# Patient Record
Sex: Female | Born: 1995 | Race: Black or African American | Hispanic: No | Marital: Single | Smoking: Never smoker
Health system: Southern US, Community
[De-identification: ages and names within clinical notes are randomized; demographics above are authoritative.]

## PROBLEM LIST (undated history)

## (undated) HISTORY — PX: OTHER SURGICAL HISTORY: SHX169

## (undated) HISTORY — PX: DILATION AND CURETTAGE OF UTERUS: SHX78

---

## 2020-05-22 ENCOUNTER — Other Ambulatory Visit (HOSPITAL_COMMUNITY)
Admission: RE | Admit: 2020-05-22 | Discharge: 2020-05-22 | Disposition: A | Discharge status: Home / Self Care | Payer: Medicaid Other | Source: Ambulatory Visit | Attending: Obstetrics and Gynecology | Admitting: Obstetrics and Gynecology

## 2020-05-22 ENCOUNTER — Encounter: Payer: Self-pay | Admitting: Obstetrics and Gynecology

## 2020-05-22 ENCOUNTER — Other Ambulatory Visit: Payer: Self-pay

## 2020-05-22 ENCOUNTER — Ambulatory Visit (INDEPENDENT_AMBULATORY_CARE_PROVIDER_SITE_OTHER): Payer: Medicaid Other | Admitting: Obstetrics and Gynecology

## 2020-05-22 DIAGNOSIS — Z01419 Encounter for gynecological examination (general) (routine) without abnormal findings: Secondary | ICD-10-CM | POA: Insufficient documentation

## 2020-05-22 DIAGNOSIS — Z30019 Encounter for initial prescription of contraceptives, unspecified: Secondary | ICD-10-CM | POA: Diagnosis not present

## 2020-05-22 MED ORDER — ETONOGESTREL-ETHINYL ESTRADIOL 0.12-0.015 MG/24HR VA RING: VAGINAL_RING | VAGINAL | 12 refills | Status: DC

## 2020-05-22 NOTE — Patient Instructions (Signed)
Ethinyl Estradiol; Etonogestrel vaginal ring What is this medicine? ETHINYL ESTRADIOL; ETONOGESTREL (ETH in il es tra DYE ole; et oh noe JES trel) vaginal ring is a flexible, vaginal ring used as a contraceptive (birth control method). This medicine combines 2 types of female hormones, an estrogen and a progestin. This ring is used to prevent ovulation and pregnancy. Each ring is effective for 1 month. This medicine may be used for other purposes; ask your health care provider or pharmacist if you have questions. COMMON BRAND NAME(S): EluRyng, NuvaRing What should I tell my health care provider before I take this medicine? They need to know if you have any of these conditions:  abnormal vaginal bleeding  blood vessel disease or blood clots  breast, cervical, endometrial, ovarian, liver, or uterine cancer  diabetes  gallbladder disease  having surgery  heart disease or recent heart attack  high blood pressure  high cholesterol or triglycerides  history of irregular heartbeat or heart valve problems  kidney disease  liver disease  migraine headaches  protein C deficiency  protein S deficiency  recently had a baby, miscarriage, or abortion  stroke  systemic lupus erythematosus (SLE)  tobacco smoker  your age is more than 25 years old  an unusual or allergic reaction to estrogens, progestins, other medicines, foods, dyes, or preservatives  pregnant or trying to get pregnant  breast-feeding How should I use this medicine? Insert the ring into your vagina as directed. Follow the directions on the prescription label. The ring will remain place for 3 weeks and is then removed for a 1-week break. A new ring is inserted 1 week after the last ring was removed, on the same day of the week. Check often to make sure the ring is still in place. If the ring was out of the vagina for an unknown amount of time, you may not be protected from pregnancy. Perform a pregnancy test and  call your doctor. Do not use more often than directed. A patient package insert for the product will be given with each prescription and refill. Read this sheet carefully each time. The sheet may change frequently. Contact your pediatrician regarding the use of this medicine in children. Special care may be needed. Overdosage: If you think you have taken too much of this medicine contact a poison control center or emergency room at once. NOTE: This medicine is only for you. Do not share this medicine with others. What if I miss a dose? You will need to use the ring exactly as directed. It is very important to follow the schedule every cycle. If you do not use the ring as directed, you may not be protected from pregnancy. If the ring should slip out, is lost, or if you leave it in longer or shorter than you should, contact your health care professional for advice. What may interact with this medicine? Do not take this medicine with the following medications:  dasabuvir; ombitasvir; paritaprevir; ritonavir  ombitasvir; paritaprevir; ritonavir  vaginal lubricants or other vaginal products that are oil-based or silicone-based This medicine may also interact with the following medications:  acetaminophen  antibiotics or medicines for infections, especially rifampin, rifabutin, rifapentine, and griseofulvin, and possibly penicillins or tetracyclines  aprepitant or fosaprepitant  armodafinil  ascorbic acid (vitamin C)  barbiturate medicines, such as phenobarbital or primidone  bosentan  certain antiviral medicines for hepatitis, HIV or AIDS  certain medicines for cancer treatment  certain medicines for seizures like carbamazepine, clobazam, felbamate, lamotrigine, oxcarbazepine, phenytoin,  rufinamide, topiramate  certain medicines for treating high cholesterol  cyclosporine  dantrolene  elagolix  flibanserin  grapefruit juice  lesinurad  medicines for diabetes  medicines  to treat fungal infections, such as griseofulvin, miconazole, fluconazole, ketoconazole, itraconazole, posaconazole or voriconazole  mifepristone  mitotane  modafinil  morphine  mycophenolate  St. John's wort  tamoxifen  temazepam  theophylline or aminophylline  thyroid hormones  tizanidine  tranexamic acid  ulipristal  warfarin This list may not describe all possible interactions. Give your health care provider a list of all the medicines, herbs, non-prescription drugs, or dietary supplements you use. Also tell them if you smoke, drink alcohol, or use illegal drugs. Some items may interact with your medicine. What should I watch for while using this medicine? Visit your doctor or health care professional for regular checks on your progress. You will need a regular breast and pelvic exam and Pap smear while on this medicine. Check with your doctor or health care professional to see if you need an additional method of contraception during the first cycle that you use this ring. Female condoms (made with natural rubber latex, polyisoprene, and polyurethane) and spermicides may be used. Do not use a diaphragm, cervical cap, or a female condom, as the ring can interfere with these birth control methods and their proper placement. If you have any reason to think you are pregnant, stop using this medicine right away and contact your doctor or health care professional. If you are using this medicine for hormone related problems, it may take several cycles of use to see improvement in your condition. Smoking increases the risk of getting a blood clot or having a stroke while you are using hormonal birth control, especially if you are more than 25 years old. You are strongly advised not to smoke. Some women are prone to getting dark patches on the skin of the face (cholasma). Your risk of getting chloasma with this medicine is higher if you had chloasma during a pregnancy. Keep out of the  sun. If you cannot avoid being in the sun, wear protective clothing and use sunscreen. Do not use sun lamps or tanning beds/booths. This medicine can make your body retain fluid, making your fingers, hands, or ankles swell. Your blood pressure can go up. Contact your doctor or health care professional if you feel you are retaining fluid. If you are going to have elective surgery, you may need to stop using this medicine before the surgery. Consult your health care professional for advice. This medicine does not protect you against HIV infection (AIDS) or any other sexually transmitted diseases. What side effects may I notice from receiving this medicine? Side effects that you should report to your doctor or health care professional as soon as possible:  allergic reactions such as skin rash or itching, hives, swelling of the lips, mouth, tongue, or throat  depression  high blood pressure  migraines or severe, sudden headaches  signs and symptoms of a blood clot such as breathing problems; changes in vision; chest pain; severe, sudden headache; pain, swelling, warmth in the leg; trouble speaking; sudden numbness or weakness of the face, arm or leg  signs and symptoms of infection like fever or chills with dizziness and a sunburn-like rash, or pain or trouble passing urine  stomach pain  symptoms of vaginal infection like itching, irritation or unusual discharge  yellowing of the eyes or skin Side effects that usually do not require medical attention (report these to your doctor   or health care professional if they continue or are bothersome):  acne  breast pain, tenderness  irregular vaginal bleeding or spotting, particularly during the first month of use  mild headache  nausea  painful periods  vomiting This list may not describe all possible side effects. Call your doctor for medical advice about side effects. You may report side effects to FDA at 1-800-FDA-1088. Where should I  keep my medicine? Keep out of the reach of children. Store unopened medicine for up to 4 months at room temperature at 15 and 30 degrees C (59 and 86 degrees F). Protect from light. Do not store above 30 degrees C (86 degrees F). Throw away any unused medicine 4 months after the dispense date or the expiration date, whichever comes first. A ring may only be used for 1 cycle (1 month). After the 3-week cycle, a used ring is removed and should be placed in the re-closable foil pouch and discarded in the trash out of reach of children and pets. Do NOT flush down the toilet. NOTE: This sheet is a summary. It may not cover all possible information. If you have questions about this medicine, talk to your doctor, pharmacist, or health care provider.  2020 Elsevier/Gold Standard (2019-03-15 12:31:47) Pap Test Why am I having this test? A Pap test, also called a Pap smear, is a screening test to check for signs of:  Cancer of the vagina, cervix, and uterus. The cervix is the lower part of the uterus that opens into the vagina.  Infection.  Changes that may be a sign that cancer is developing (precancerous changes). Women need this test on a regular basis. In general, you should have a Pap test every 3 years until you reach menopause or age 46. Women aged 30-60 may choose to have their Pap test done at the same time as an HPV (human papillomavirus) test every 5 years (instead of every 3 years). Your health care provider may recommend having Pap tests more or less often depending on your medical conditions and past Pap test results. What kind of sample is taken?  Your health care provider will collect a sample of cells from the surface of your cervix. This will be done using a small cotton swab, plastic spatula, or brush. This sample is often collected during a pelvic exam, when you are lying on your back on an exam table with feet in footrests (stirrups). In some cases, fluids (secretions) from the cervix  or vagina may also be collected. How do I prepare for this test?  Be aware of where you are in your menstrual cycle. If you are menstruating on the day of the test, you may be asked to reschedule.  You may need to reschedule if you have a known vaginal infection on the day of the test.  Follow instructions from your health care provider about: ? Changing or stopping your regular medicines. Some medicines can cause abnormal test results, such as digitalis and tetracycline. ? Avoiding douching or taking a bath the day before or the day of the test. Tell a health care provider about:  Any allergies you have.  All medicines you are taking, including vitamins, herbs, eye drops, creams, and over-the-counter medicines.  Any blood disorders you have.  Any surgeries you have had.  Any medical conditions you have.  Whether you are pregnant or may be pregnant. How are the results reported? Your test results will be reported as either abnormal or normal. A false-positive result  can occur. A false positive is incorrect because it means that a condition is present when it is not. A false-negative result can occur. A false negative is incorrect because it means that a condition is not present when it is. What do the results mean? A normal test result means that you do not have signs of cancer of the vagina, cervix, or uterus. An abnormal result may mean that you have:  Cancer. A Pap test by itself is not enough to diagnose cancer. You will have more tests done in this case.  Precancerous changes in your vagina, cervix, or uterus.  Inflammation of the cervix.  An STD (sexually transmitted disease).  A fungal infection.  A parasite infection. Talk with your health care provider about what your results mean. Questions to ask your health care provider Ask your health care provider, or the department that is doing the test:  When will my results be ready?  How will I get my  results?  What are my treatment options?  What other tests do I need?  What are my next steps? Summary  In general, women should have a Pap test every 3 years until they reach menopause or age 13.  Your health care provider will collect a sample of cells from the surface of your cervix. This will be done using a small cotton swab, plastic spatula, or brush.  In some cases, fluids (secretions) from the cervix or vagina may also be collected. This information is not intended to replace advice given to you by your health care provider. Make sure you discuss any questions you have with your health care provider. Document Revised: 05/02/2017 Document Reviewed: 05/02/2017 Elsevier Patient Education  2020 [employer].

## 2020-05-22 NOTE — Progress Notes (Signed)
Patient presents as a New Patient AEX and STD testing. Patient complains of having frequent bv. She also want to be checked for yeast and bv. She declines having vaginal discharge, odor, or irritation at this time.  She would also like to discuss birth control today.  Pt declines flu vaccine today.

## 2020-05-22 NOTE — Progress Notes (Signed)
GYNECOLOGY ANNUAL PREVENTATIVE CARE ENCOUNTER NOTE  History:     Megan Hansen is a 24 y.o. G54P1001 female here for a routine annual gynecologic exam.  Current complaints: none.   Denies abnormal vaginal bleeding, pelvic pain, problems with intercourse or other gynecologic concerns.   She notes occasional vaginal discharge c/w BV which she thinks she may have now.  The patient is interested in starting a new birth control.   Gynecologic History Patient's last menstrual period was 05/16/2020. Contraception: none Last Pap: 2020 in [place]. Results were: normal per patient Last mammogram: n/a.   Obstetric History OB History  Gravida Para Term Preterm AB Living  1 1 1     1   SAB TAB Ectopic Multiple Live Births          1    # Outcome Date GA Lbr Len/2nd Weight Sex Delivery Anes PTL Lv  1 Term 04/01/18 [redacted]w[redacted]d   M CS-LTranv   LIV    History reviewed. No pertinent past medical history.  Past Surgical History:  Procedure Laterality Date  . CESAREAN SECTION  04/01/2018  . fibroidectomy Bilateral    Breast    No current outpatient medications on file prior to visit.   No current facility-administered medications on file prior to visit.    Not on File  Social History:  reports that she has never smoked. She has never used smokeless tobacco. She reports previous alcohol use. She reports previous drug use. Pt notes social EtOH and THC History reviewed. No pertinent family history.  The following portions of the patient's history were reviewed and updated as appropriate: allergies, current medications, past family history, past medical history, past social history, past surgical history and problem list.  Review of Systems Pertinent items noted in HPI and remainder of comprehensive ROS otherwise negative.  Physical Exam:  BP 99/73   Pulse 63   Ht 5\' 5"  (1.651 m)   Wt 176 lb 1.6 oz (79.9 kg)   LMP 05/16/2020   BMI 29.30 kg/m  CONSTITUTIONAL: Well-developed,  well-nourished female in no acute distress.  HENT:  Normocephalic, atraumatic, External right and left ear normal. Oropharynx is clear and moist EYES: Conjunctivae and EOM are normal. Pupils are equal, round, and reactive to light. No scleral icterus.  NECK: Normal range of motion, supple, no masses.  Normal thyroid.  SKIN: Skin is warm and dry. No rash noted. Not diaphoretic. No erythema. No pallor. MUSCULOSKELETAL: Normal range of motion. No tenderness.  No cyanosis, clubbing, or edema.  2+ distal pulses. NEUROLOGIC: Alert and oriented to person, place, and time. Normal reflexes, muscle tone coordination.  PSYCHIATRIC: Normal mood and affect. Normal behavior. Normal judgment and thought content. CARDIOVASCULAR: Normal heart rate noted, regular rhythm RESPIRATORY: Clear to auscultation bilaterally. Effort and breath sounds normal, no problems with respiration noted. BREASTS: Symmetric in size. No masses, tenderness, skin changes, nipple drainage, or lymphadenopathy bilaterally. Performed in the presence of a chaperone. ABDOMEN: Soft, no distention noted.  No tenderness, rebound or guarding.  PELVIC: Normal appearing external genitalia and urethral meatus; normal appearing vaginal mucosa and cervix.  No abnormal discharge noted.  Pap smear obtained.  Normal uterine size, no other palpable masses, no uterine or adnexal tenderness.  Performed in the presence of a chaperone.   Assessment and Plan:    1. Encounter for annual routine gynecological examination  - Cytology - PAP( [place]) - Cervicovaginal ancillary only( [place]) - Hepatitis C Antibody - Hepatitis B surface antigen -  RPR - HIV antibody (with reflex)  2. Encounter for female birth control Discussed multiple options for birth control including pills, patch IUD and other implanted devices.  Pt desires nuvaring (vaginal ring).  Pt given instruction on placement and potential side effects - etonogestrel-ethinyl estradiol  (NUVARING) 0.12-0.015 MG/24HR vaginal ring; Insert vaginally and leave in place for 3 consecutive weeks, then remove for 1 week.  Dispense: 1 each; Refill: 12  Will follow up results of pap smear and manage accordingly.  Routine preventative health maintenance measures emphasized. Please refer to After Visit Summary for other counseling recommendations.      Mariel Aloe, MD, FACOG Obstetrician & Gynecologist, [facility] for [facility], [facility]

## 2020-05-23 LAB — CERVICOVAGINAL ANCILLARY ONLY
Bacterial Vaginitis (gardnerella): POSITIVE — AB
Candida Glabrata: NEGATIVE
Candida Vaginitis: NEGATIVE
Chlamydia: NEGATIVE
Comment: NEGATIVE
Comment: NEGATIVE
Comment: NEGATIVE
Comment: NEGATIVE
Comment: NEGATIVE
Comment: NORMAL
Neisseria Gonorrhea: NEGATIVE
Trichomonas: NEGATIVE

## 2020-05-23 LAB — HIV ANTIBODY (ROUTINE TESTING W REFLEX): HIV Screen 4th Generation wRfx: NONREACTIVE

## 2020-05-23 LAB — HEPATITIS C ANTIBODY: Hep C Virus Ab: <0.1 s/co ratio (ref 0.0–0.9)

## 2020-05-23 LAB — CYTOLOGY - PAP: Diagnosis: NEGATIVE

## 2020-05-23 LAB — HEPATITIS B SURFACE ANTIGEN: Hepatitis B Surface Ag: NEGATIVE

## 2020-05-23 LAB — RPR: RPR Ser Ql: NONREACTIVE

## 2020-05-26 ENCOUNTER — Other Ambulatory Visit: Payer: Self-pay

## 2020-05-26 MED ORDER — METRONIDAZOLE 500 MG PO TABS: 500.0000 mg | ORAL_TABLET | Freq: Two times a day (BID) | ORAL | 0 refills | Status: DC

## 2020-05-26 NOTE — Telephone Encounter (Signed)
Inform patient of test results and the need to be treated for BV. Patient is having symptoms of BV Flagyl will be sent into her pharmacy.

## 2020-06-24 ENCOUNTER — Other Ambulatory Visit: Payer: Self-pay

## 2020-06-24 DIAGNOSIS — N898 Other specified noninflammatory disorders of vagina: Secondary | ICD-10-CM

## 2020-06-24 MED ORDER — METRONIDAZOLE 500 MG PO TABS: 500.0000 mg | ORAL_TABLET | Freq: Two times a day (BID) | ORAL | 0 refills | Status: DC

## 2020-06-24 MED ORDER — FLUCONAZOLE 150 MG PO TABS: 150.0000 mg | ORAL_TABLET | Freq: Once | ORAL | 0 refills | Status: DC

## 2020-06-24 NOTE — Progress Notes (Signed)
Rx's sent per protocol for BV and yeast  Pt made aware is no relief after 10-14 days to schedule appt for vaginal swab Pt voiced understanding.

## 2020-10-03 DIAGNOSIS — U071 COVID-19: Secondary | ICD-10-CM | POA: Diagnosis not present

## 2020-10-16 ENCOUNTER — Other Ambulatory Visit (HOSPITAL_COMMUNITY)
Admission: RE | Admit: 2020-10-16 | Discharge: 2020-10-16 | Disposition: A | Discharge status: Home / Self Care | Payer: BC Managed Care – PPO | Source: Ambulatory Visit | Attending: Obstetrics and Gynecology | Admitting: Obstetrics and Gynecology

## 2020-10-16 ENCOUNTER — Other Ambulatory Visit: Payer: Self-pay

## 2020-10-16 ENCOUNTER — Ambulatory Visit (INDEPENDENT_AMBULATORY_CARE_PROVIDER_SITE_OTHER): Payer: BC Managed Care – PPO | Admitting: *Deleted

## 2020-10-16 DIAGNOSIS — B9689 Other specified bacterial agents as the cause of diseases classified elsewhere: Secondary | ICD-10-CM | POA: Insufficient documentation

## 2020-10-16 DIAGNOSIS — N898 Other specified noninflammatory disorders of vagina: Secondary | ICD-10-CM

## 2020-10-16 DIAGNOSIS — B373 Candidiasis of vulva and vagina: Secondary | ICD-10-CM | POA: Insufficient documentation

## 2020-10-16 DIAGNOSIS — Z113 Encounter for screening for infections with a predominantly sexual mode of transmission: Secondary | ICD-10-CM

## 2020-10-16 DIAGNOSIS — N760 Acute vaginitis: Secondary | ICD-10-CM | POA: Diagnosis not present

## 2020-10-16 NOTE — Progress Notes (Signed)
SUBJECTIVE:  25 y.o. female complains of thin vaginal discharge for the last few days. Denies abnormal vaginal bleeding or significant pelvic pain or fever. No UTI symptoms. Denies history of known exposure to STD.  Pt states she is also interested starting on BC pills. Pt has not been using Nuva Ring as prescribed; states she has used Slynd in the past.  Pt would like to know if she can get Rx.   LMP 09/20/2020   OBJECTIVE:  She appears well, afebrile. Urine dipstick: not done.  ASSESSMENT:  Vaginal Discharge  Vaginal Odor   PLAN:  Pt request full panel self swab today.  GC, chlamydia, trichomonas, BVAG, CVAG probe sent to lab. Message will be sent to provider regarding BC pills.   Treatment: To be determined once lab results are received ROV prn if symptoms persist or worsen.

## 2020-10-16 NOTE — Progress Notes (Signed)
Patient was assessed and managed by nursing staff during this encounter. I have reviewed the chart and agree with the documentation and plan. I have also made any necessary editorial changes.  Laiana Fratus A Dominick Zertuche, MD 10/16/2020 1:23 PM   

## 2020-10-17 LAB — CERVICOVAGINAL ANCILLARY ONLY
Bacterial Vaginitis (gardnerella): POSITIVE — AB
Candida Glabrata: NEGATIVE
Candida Vaginitis: POSITIVE — AB
Chlamydia: NEGATIVE
Comment: NEGATIVE
Comment: NEGATIVE
Comment: NEGATIVE
Comment: NEGATIVE
Comment: NEGATIVE
Comment: NORMAL
Neisseria Gonorrhea: NEGATIVE
Trichomonas: NEGATIVE

## 2020-10-20 ENCOUNTER — Other Ambulatory Visit: Payer: Self-pay

## 2020-10-20 DIAGNOSIS — B9689 Other specified bacterial agents as the cause of diseases classified elsewhere: Secondary | ICD-10-CM

## 2020-10-20 DIAGNOSIS — B379 Candidiasis, unspecified: Secondary | ICD-10-CM

## 2020-10-20 MED ORDER — FLUCONAZOLE 150 MG PO TABS: 150.0000 mg | ORAL_TABLET | Freq: Once | ORAL | 0 refills | Status: AC

## 2020-10-20 MED ORDER — METRONIDAZOLE 500 MG PO TABS: 500.0000 mg | ORAL_TABLET | Freq: Two times a day (BID) | ORAL | 0 refills | Status: DC

## 2020-12-01 ENCOUNTER — Other Ambulatory Visit: Payer: Self-pay

## 2020-12-01 DIAGNOSIS — B9689 Other specified bacterial agents as the cause of diseases classified elsewhere: Secondary | ICD-10-CM

## 2020-12-01 MED ORDER — FLUCONAZOLE 150 MG PO TABS: 150.0000 mg | ORAL_TABLET | Freq: Once | ORAL | 0 refills | Status: AC

## 2020-12-01 MED ORDER — METRONIDAZOLE 500 MG PO TABS: 500.0000 mg | ORAL_TABLET | Freq: Two times a day (BID) | ORAL | 0 refills | Status: DC

## 2020-12-12 DIAGNOSIS — J219 Acute bronchiolitis, unspecified: Secondary | ICD-10-CM | POA: Diagnosis not present

## 2021-01-26 ENCOUNTER — Emergency Department (HOSPITAL_BASED_OUTPATIENT_CLINIC_OR_DEPARTMENT_OTHER)
Admission: EM | Admit: 2021-01-26 | Discharge: 2021-01-26 | Disposition: A | Discharge status: Home / Self Care | Payer: BC Managed Care – PPO | Attending: Emergency Medicine | Admitting: Emergency Medicine

## 2021-01-26 ENCOUNTER — Encounter (HOSPITAL_BASED_OUTPATIENT_CLINIC_OR_DEPARTMENT_OTHER): Payer: Self-pay | Admitting: Obstetrics and Gynecology

## 2021-01-26 ENCOUNTER — Other Ambulatory Visit: Payer: Self-pay

## 2021-01-26 DIAGNOSIS — Z9189 Other specified personal risk factors, not elsewhere classified: Secondary | ICD-10-CM

## 2021-01-26 DIAGNOSIS — Z202 Contact with and (suspected) exposure to infections with a predominantly sexual mode of transmission: Secondary | ICD-10-CM | POA: Insufficient documentation

## 2021-01-26 LAB — URINALYSIS, ROUTINE W REFLEX MICROSCOPIC
Bilirubin Urine: NEGATIVE
Glucose, UA: NEGATIVE mg/dL
Hgb urine dipstick: NEGATIVE
Ketones, ur: NEGATIVE mg/dL
Leukocytes,Ua: NEGATIVE
Nitrite: NEGATIVE
Protein, ur: NEGATIVE mg/dL
Specific Gravity, Urine: <1.005 — ABNORMAL LOW (ref 1.005–1.030)
pH: 6 (ref 5.0–8.0)

## 2021-01-26 LAB — WET PREP, GENITAL
Clue Cells Wet Prep HPF POC: NONE SEEN
Sperm: NONE SEEN
Trich, Wet Prep: NONE SEEN
Yeast Wet Prep HPF POC: NONE SEEN

## 2021-01-26 LAB — PREGNANCY, URINE: Preg Test, Ur: NEGATIVE

## 2021-01-26 LAB — HIV ANTIBODY (ROUTINE TESTING W REFLEX): HIV Screen 4th Generation wRfx: NONREACTIVE

## 2021-01-26 MED ORDER — ELVITEG-COBIC-EMTRICIT-TENOFAF 150-150-200-10 MG PO TABS: 1.0000 | ORAL_TABLET | Freq: Every day | ORAL | 0 refills | Status: DC

## 2021-01-26 MED ORDER — LIDOCAINE HCL (PF) 1 % IJ SOLN
1.0000 mL | Freq: Once | INTRAMUSCULAR | Status: AC
  Administered 2021-01-26: 1 mL
  Filled 2021-01-26: qty 5

## 2021-01-26 MED ORDER — ELVITEG-COBIC-EMTRICIT-TENOFAF 150-150-200-10 MG PREPACK
1.0000 | ORAL_TABLET | Freq: Once | ORAL | Status: AC
  Administered 2021-01-26: 1 via ORAL
  Filled 2021-01-26: qty 1

## 2021-01-26 MED ORDER — CEFTRIAXONE SODIUM 500 MG IJ SOLR
500.0000 mg | Freq: Once | INTRAMUSCULAR | Status: AC
  Administered 2021-01-26: 500 mg via INTRAMUSCULAR
  Filled 2021-01-26: qty 500

## 2021-01-26 MED ORDER — AZITHROMYCIN 250 MG PO TABS
1000.0000 mg | ORAL_TABLET | Freq: Once | ORAL | Status: AC
  Administered 2021-01-26: 1000 mg via ORAL
  Filled 2021-01-26: qty 4

## 2021-01-26 NOTE — ED Notes (Signed)
Pt given saltines and ginger ale, per Madelaine Bhat - RN.

## 2021-01-26 NOTE — Discharge Instructions (Signed)
You have been given prophylaxis for possible HIV exposure.  Your tests have been drawn.  You were also treated for possible STDs.  Your lab work today was reassuring.  Follow-up with the health department and you will usually get rechecked in about 6 weeks.

## 2021-01-26 NOTE — ED Triage Notes (Signed)
Patient reports to the ER for exposure to HIV. Patient reports she had vaginal intercourse with this individual on Friday night. Patient denies condom use. Patient wants to be checked for this.
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
## 2021-01-27 LAB — GC/CHLAMYDIA PROBE AMP (~~LOC~~) NOT AT ARMC
Chlamydia: NEGATIVE
Comment: NEGATIVE
Comment: NORMAL
Neisseria Gonorrhea: NEGATIVE

## 2021-01-27 LAB — RPR: RPR Ser Ql: NONREACTIVE

## 2021-02-10 DIAGNOSIS — Z03818 Encounter for observation for suspected exposure to other biological agents ruled out: Secondary | ICD-10-CM | POA: Diagnosis not present

## 2021-02-10 DIAGNOSIS — Z20822 Contact with and (suspected) exposure to covid-19: Secondary | ICD-10-CM | POA: Diagnosis not present

## 2021-02-10 DIAGNOSIS — R059 Cough, unspecified: Secondary | ICD-10-CM | POA: Diagnosis not present

## 2021-02-25 ENCOUNTER — Ambulatory Visit: Payer: BC Managed Care – PPO

## 2021-02-25 ENCOUNTER — Other Ambulatory Visit: Payer: Self-pay

## 2021-02-25 ENCOUNTER — Other Ambulatory Visit (HOSPITAL_COMMUNITY)
Admission: RE | Admit: 2021-02-25 | Discharge: 2021-02-25 | Disposition: A | Discharge status: Home / Self Care | Payer: BC Managed Care – PPO | Source: Ambulatory Visit | Attending: Obstetrics | Admitting: Obstetrics

## 2021-02-25 VITALS — BP 119/78 | HR 75

## 2021-02-25 DIAGNOSIS — N898 Other specified noninflammatory disorders of vagina: Secondary | ICD-10-CM | POA: Diagnosis not present

## 2021-02-25 MED ORDER — METRONIDAZOLE 500 MG PO TABS: 500.0000 mg | ORAL_TABLET | Freq: Two times a day (BID) | ORAL | 0 refills | Status: DC

## 2021-02-25 NOTE — Progress Notes (Signed)
SUBJECTIVE:  25 y.o. female complains of clear vaginal discharge for a couple of days. . Denies abnormal vaginal bleeding or significant pelvic pain or fever. No UTI symptoms. Denies history of known exposure to STD.  No LMP recorded.  OBJECTIVE:  She appears well, afebrile. Urine dipstick: not done.  ASSESSMENT:  Vaginal Discharge: small amount  Vaginal Odor: small amount    PLAN:  GC, chlamydia, trichomonas, BVAG, CVAG probe sent to lab. Treatment: To be determined once lab results are received ROV prn if symptoms persist or worsen. Patient request to have flagyl called into her pharmacy.

## 2021-03-02 LAB — CERVICOVAGINAL ANCILLARY ONLY
Bacterial Vaginitis (gardnerella): POSITIVE — AB
Candida Glabrata: NEGATIVE
Candida Vaginitis: NEGATIVE
Chlamydia: POSITIVE — AB
Comment: NEGATIVE
Comment: NEGATIVE
Comment: NEGATIVE
Comment: NEGATIVE
Comment: NEGATIVE
Comment: NORMAL
Neisseria Gonorrhea: NEGATIVE
Trichomonas: NEGATIVE

## 2021-03-03 ENCOUNTER — Telehealth: Payer: Self-pay

## 2021-03-03 ENCOUNTER — Other Ambulatory Visit: Payer: Self-pay | Admitting: Obstetrics

## 2021-03-03 DIAGNOSIS — A749 Chlamydial infection, unspecified: Secondary | ICD-10-CM

## 2021-03-03 MED ORDER — DOXYCYCLINE HYCLATE 100 MG PO CAPS: 100.0000 mg | ORAL_CAPSULE | Freq: Two times a day (BID) | ORAL | 0 refills | Status: DC

## 2021-03-03 NOTE — Telephone Encounter (Signed)
-----   Message from Brock Bad, MD sent at 03/03/2021  8:25 AM EDT ----- Doxycycline Rx for Chlamydia Flagyl Rx for BV

## 2021-03-03 NOTE — Telephone Encounter (Signed)
Call patient to inform her of test results. She should refrain from having intercourse for 7-10 days until her and partner have been treated. TOC in 4 weeks.  STD Card completed and fax.

## 2021-03-06 ENCOUNTER — Other Ambulatory Visit: Payer: Self-pay

## 2021-03-06 DIAGNOSIS — N898 Other specified noninflammatory disorders of vagina: Secondary | ICD-10-CM

## 2021-03-06 DIAGNOSIS — A749 Chlamydial infection, unspecified: Secondary | ICD-10-CM

## 2021-03-06 MED ORDER — ONDANSETRON 4 MG PO TBDP: 4.0000 mg | ORAL_TABLET | Freq: Three times a day (TID) | ORAL | 0 refills | Status: DC | PRN

## 2021-03-06 MED ORDER — AZITHROMYCIN 500 MG PO TABS
1000.0000 mg | ORAL_TABLET | Freq: Once | ORAL | 1 refills | Status: AC
Start: 2021-03-06 — End: 2021-03-06

## 2021-03-06 MED ORDER — FLUCONAZOLE 150 MG PO TABS: 150.0000 mg | ORAL_TABLET | Freq: Once | ORAL | 0 refills | Status: AC
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
## 2021-03-25 ENCOUNTER — Ambulatory Visit (INDEPENDENT_AMBULATORY_CARE_PROVIDER_SITE_OTHER): Payer: BC Managed Care – PPO

## 2021-03-25 ENCOUNTER — Other Ambulatory Visit: Payer: Self-pay

## 2021-03-25 ENCOUNTER — Other Ambulatory Visit (HOSPITAL_COMMUNITY)
Admission: RE | Admit: 2021-03-25 | Discharge: 2021-03-25 | Disposition: A | Discharge status: Home / Self Care | Payer: BC Managed Care – PPO | Source: Ambulatory Visit | Attending: Family Medicine | Admitting: Family Medicine

## 2021-03-25 VITALS — BP 115/80 | HR 64 | Ht 65.0 in | Wt 183.0 lb

## 2021-03-25 DIAGNOSIS — N898 Other specified noninflammatory disorders of vagina: Secondary | ICD-10-CM

## 2021-03-25 NOTE — Progress Notes (Signed)
SUBJECTIVE:  25 y.o. female complains of creamy vaginal discharge, itching for 6 day(s). Denies abnormal vaginal bleeding or significant pelvic pain or fever. No UTI symptoms. Denies history of known exposure to STD.  No LMP recorded.  OBJECTIVE:  She appears well, afebrile. Urine dipstick: not done.  ASSESSMENT:  Vaginal Discharge  Vaginal itching   PLAN:  GC, chlamydia, trichomonas, BVAG, CVAG probe sent to lab. Treatment: To be determined once lab results are received ROV prn if symptoms persist or worsen.

## 2021-03-25 NOTE — Progress Notes (Signed)
Chart reviewed for nurse visit. Agree with plan of care.   Evalynn Hankins M, MD 03/25/21 2:18 PM 

## 2021-03-26 LAB — CERVICOVAGINAL ANCILLARY ONLY
Bacterial Vaginitis (gardnerella): POSITIVE — AB
Candida Glabrata: NEGATIVE
Candida Vaginitis: NEGATIVE
Chlamydia: NEGATIVE
Comment: NEGATIVE
Comment: NEGATIVE
Comment: NEGATIVE
Comment: NEGATIVE
Comment: NEGATIVE
Comment: NORMAL
Neisseria Gonorrhea: NEGATIVE
Trichomonas: NEGATIVE

## 2021-03-27 ENCOUNTER — Other Ambulatory Visit: Payer: Self-pay | Admitting: Obstetrics

## 2021-03-27 ENCOUNTER — Other Ambulatory Visit: Payer: Self-pay

## 2021-03-29 MED ORDER — METRONIDAZOLE 500 MG PO TABS: 500.0000 mg | ORAL_TABLET | Freq: Two times a day (BID) | ORAL | 0 refills | Status: AC

## 2021-03-29 NOTE — Addendum Note (Signed)
Addended by: Merian Capron on: 03/29/2021 08:04 PM   Modules accepted: Orders

## 2021-05-27 ENCOUNTER — Ambulatory Visit (INDEPENDENT_AMBULATORY_CARE_PROVIDER_SITE_OTHER): Payer: BC Managed Care – PPO

## 2021-05-27 ENCOUNTER — Other Ambulatory Visit: Payer: Self-pay

## 2021-05-27 ENCOUNTER — Other Ambulatory Visit (HOSPITAL_COMMUNITY)
Admission: RE | Admit: 2021-05-27 | Discharge: 2021-05-27 | Disposition: A | Discharge status: Home / Self Care | Payer: BC Managed Care – PPO | Source: Ambulatory Visit | Attending: Obstetrics and Gynecology | Admitting: Obstetrics and Gynecology

## 2021-05-27 DIAGNOSIS — N898 Other specified noninflammatory disorders of vagina: Secondary | ICD-10-CM | POA: Diagnosis not present

## 2021-05-27 NOTE — Progress Notes (Signed)
Agree with A & P. 

## 2021-05-27 NOTE — Progress Notes (Signed)
SUBJECTIVE:  25 y.o. female complains of vaginal itching x 3-4 days. Denies abnormal vaginal bleeding or significant pelvic pain or fever. No UTI symptoms. Denies history of known exposure to STD.   OBJECTIVE:  She appears well, afebrile. Urine dipstick: not done.  ASSESSMENT:  Vaginal itching, discharge   PLAN:  GC, chlamydia, trichomonas, BVAG, CVAG probe sent to lab. Treatment: To be determined once lab results are reviewed by the provider ROV prn if symptoms persist or worsen.

## 2021-05-28 LAB — CERVICOVAGINAL ANCILLARY ONLY
Bacterial Vaginitis (gardnerella): NEGATIVE
Candida Glabrata: NEGATIVE
Candida Vaginitis: POSITIVE — AB
Chlamydia: NEGATIVE
Comment: NEGATIVE
Comment: NEGATIVE
Comment: NEGATIVE
Comment: NEGATIVE
Comment: NEGATIVE
Comment: NORMAL
Neisseria Gonorrhea: NEGATIVE
Trichomonas: NEGATIVE

## 2021-05-29 ENCOUNTER — Other Ambulatory Visit: Payer: Self-pay

## 2021-05-29 DIAGNOSIS — B379 Candidiasis, unspecified: Secondary | ICD-10-CM

## 2021-05-29 MED ORDER — FLUCONAZOLE 150 MG PO TABS: 150.0000 mg | ORAL_TABLET | Freq: Once | ORAL | 0 refills | Status: AC

## 2021-06-19 ENCOUNTER — Telehealth: Payer: Self-pay | Admitting: *Deleted

## 2021-06-19 NOTE — Telephone Encounter (Signed)
Pt called to office requesting treatment for yeast. Pt was just treated at end of Sept.  Pt advised cannot treat at this time. Request will be routed to provider for approval/recommendations.   Please review, reply to clinical pool.

## 2021-06-23 ENCOUNTER — Other Ambulatory Visit: Payer: Self-pay

## 2021-06-23 MED ORDER — FLUCONAZOLE 150 MG PO TABS: 150.0000 mg | ORAL_TABLET | Freq: Once | ORAL | 0 refills | Status: AC

## 2021-06-23 NOTE — Telephone Encounter (Signed)
Follow up phone call to patient. Provider is ok with sending in one refill of diflucan. Patient must have  Annual exam before any more refills are sent. Patient transfer to front office to schedule.

## 2021-08-05 ENCOUNTER — Ambulatory Visit (INDEPENDENT_AMBULATORY_CARE_PROVIDER_SITE_OTHER): Payer: BC Managed Care – PPO | Admitting: Obstetrics and Gynecology

## 2021-08-05 ENCOUNTER — Other Ambulatory Visit (HOSPITAL_COMMUNITY)
Admission: RE | Admit: 2021-08-05 | Discharge: 2021-08-05 | Disposition: A | Discharge status: Home / Self Care | Payer: BC Managed Care – PPO | Source: Ambulatory Visit | Attending: Obstetrics and Gynecology | Admitting: Obstetrics and Gynecology

## 2021-08-05 ENCOUNTER — Encounter: Payer: Self-pay | Admitting: Obstetrics and Gynecology

## 2021-08-05 ENCOUNTER — Other Ambulatory Visit: Payer: Self-pay

## 2021-08-05 VITALS — BP 108/79 | HR 65 | Ht 66.0 in | Wt 178.0 lb

## 2021-08-05 DIAGNOSIS — Z30019 Encounter for initial prescription of contraceptives, unspecified: Secondary | ICD-10-CM

## 2021-08-05 DIAGNOSIS — Z01419 Encounter for gynecological examination (general) (routine) without abnormal findings: Secondary | ICD-10-CM | POA: Insufficient documentation

## 2021-08-05 MED ORDER — LO LOESTRIN FE 1 MG-10 MCG / 10 MCG PO TABS: 1.0000 | ORAL_TABLET | Freq: Every day | ORAL | 11 refills | Status: DC

## 2021-08-05 NOTE — Progress Notes (Signed)
Megan Hansen is a 25 y.o. G88P1001 female here for a routine annual gynecologic exam.  Current complaints: some vaginal itching and desires to start OCP's. .   Denies abnormal vaginal bleeding, discharge, pelvic pain, problems with intercourse or other gynecologic concerns.    Gynecologic History Patient's last menstrual period was 08/01/2021 (exact date). Contraception: none Last Pap: 9/21. Results were: normal Last mammogram: NA  Obstetric History OB History  Gravida Para Term Preterm AB Living  1 1 1     1   SAB IAB Ectopic Multiple Live Births          1    # Outcome Date GA Lbr Len/2nd Weight Sex Delivery Anes PTL Lv  1 Term 04/01/18 [redacted]w[redacted]d   M CS-LTranv   LIV    History reviewed. No pertinent past medical history.  Past Surgical History:  Procedure Laterality Date   CESAREAN SECTION  04/01/2018   fibroidectomy Bilateral    Breast    Current Outpatient Medications on File Prior to Visit  Medication Sig Dispense Refill   ondansetron (ZOFRAN ODT) 4 MG disintegrating tablet Take 1 tablet (4 mg total) by mouth every 8 (eight) hours as needed for nausea or vomiting. 20 tablet 0   No current facility-administered medications on file prior to visit.    Not on File  Social History   Socioeconomic History   Marital status: Single    Spouse name: Not on file   Number of children: Not on file   Years of education: Not on file   Highest education level: Not on file  Occupational History   Not on file  Tobacco Use   Smoking status: Never   Smokeless tobacco: Never  Vaping Use   Vaping Use: Never used  Substance and Sexual Activity   Alcohol use: Not Currently   Drug use: Yes    Types: Marijuana   Sexual activity: Not Currently  Other Topics Concern   Not on file  Social History Narrative   Not on file   Social Determinants of Health   Financial Resource Strain: Not on file  Food Insecurity: Not on file  Transportation Needs: Not on file  Physical Activity:  Not on file  Stress: Not on file  Social Connections: Not on file  Intimate Partner Violence: Not on file    History reviewed. No pertinent family history.  The following portions of the patient's history were reviewed and updated as appropriate: allergies, current medications, past family history, past medical history, past social history, past surgical history and problem list.  Review of Systems Pertinent items noted in HPI and remainder of comprehensive ROS otherwise negative.   Objective:  BP 108/79   Pulse 65   Ht 5\' 6"  (1.676 m)   Wt 178 lb (80.7 kg)   LMP 08/01/2021 (Exact Date)   BMI 28.73 kg/m  CONSTITUTIONAL: Well-developed, well-nourished female in no acute distress.  HENT:  Normocephalic, atraumatic, External right and left ear normal. Oropharynx is clear and moist EYES: Conjunctivae and EOM are normal. Pupils are equal, round, and reactive to light. No scleral icterus.  NECK: Normal range of motion, supple, no masses.  Normal thyroid.  SKIN: Skin is warm and dry. No rash noted. Not diaphoretic. No erythema. No pallor. NEUROLGIC: Alert and oriented to person, place, and time. Normal reflexes, muscle tone coordination. No cranial nerve deficit noted. PSYCHIATRIC: Normal mood and affect. Normal behavior. Normal judgment and thought content. CARDIOVASCULAR: Normal heart rate noted, regular rhythm RESPIRATORY: Clear to  auscultation bilaterally. Effort and breath sounds normal, no problems with respiration noted. BREASTS: Symmetric in size. No masses, skin changes, nipple drainage, or lymphadenopathy. ABDOMEN: Soft, normal bowel sounds, no distention noted.  No tenderness, rebound or guarding.  PELVIC: Normal appearing external genitalia; normal appearing vaginal mucosa and cervix.  No abnormal discharge noted.   Normal uterine size, no other palpable masses, no uterine or adnexal tenderness. MUSCULOSKELETAL: Normal range of motion. No tenderness.  No cyanosis, clubbing, or  edema.  2+ distal pulses.   Assessment:  Annual gynecologic examination with pap smear Vaginal STD testing, pt declined blood work Contraception management Plan:  Will follow up results of STD testing and manage accordingly. R/B/Back up method of OCP's reviewed with pt.  Routine preventative health maintenance measures emphasized. Please refer to After Visit Summary for other counseling recommendations.    Hermina Staggers, MD, FACOG Attending Obstetrician & Gynecologist Center for [facility], [facility]

## 2021-08-05 NOTE — Progress Notes (Signed)
GYN annual exam Last Pap 05/2020, normal, one previous to that abnormal. Wants STI swab, declines blood work Vag itching "off and on".

## 2021-08-05 NOTE — Patient Instructions (Signed)

## 2021-08-06 LAB — CERVICOVAGINAL ANCILLARY ONLY
Bacterial Vaginitis (gardnerella): POSITIVE — AB
Candida Glabrata: NEGATIVE
Candida Vaginitis: NEGATIVE
Chlamydia: NEGATIVE
Comment: NEGATIVE
Comment: NEGATIVE
Comment: NEGATIVE
Comment: NEGATIVE
Comment: NEGATIVE
Comment: NORMAL
Neisseria Gonorrhea: NEGATIVE
Trichomonas: NEGATIVE

## 2021-08-06 LAB — CYTOLOGY - PAP: Diagnosis: NEGATIVE

## 2021-08-07 ENCOUNTER — Other Ambulatory Visit: Payer: Self-pay

## 2021-08-07 DIAGNOSIS — B9689 Other specified bacterial agents as the cause of diseases classified elsewhere: Secondary | ICD-10-CM

## 2021-08-07 DIAGNOSIS — N760 Acute vaginitis: Secondary | ICD-10-CM

## 2021-08-07 MED ORDER — METRONIDAZOLE 500 MG PO TABS: 500.0000 mg | ORAL_TABLET | Freq: Two times a day (BID) | ORAL | 0 refills | Status: DC

## 2021-08-07 NOTE — Progress Notes (Signed)
Flagyl for BV sent to the pharmacy

## 2021-09-14 ENCOUNTER — Other Ambulatory Visit: Payer: Self-pay

## 2021-09-14 ENCOUNTER — Ambulatory Visit (INDEPENDENT_AMBULATORY_CARE_PROVIDER_SITE_OTHER): Payer: BC Managed Care – PPO | Admitting: *Deleted

## 2021-09-14 ENCOUNTER — Other Ambulatory Visit (HOSPITAL_COMMUNITY)
Admission: RE | Admit: 2021-09-14 | Discharge: 2021-09-14 | Disposition: A | Discharge status: Home / Self Care | Payer: BC Managed Care – PPO | Source: Ambulatory Visit | Attending: Obstetrics and Gynecology | Admitting: Obstetrics and Gynecology

## 2021-09-14 VITALS — BP 127/87 | HR 79

## 2021-09-14 DIAGNOSIS — N898 Other specified noninflammatory disorders of vagina: Secondary | ICD-10-CM | POA: Insufficient documentation

## 2021-09-14 DIAGNOSIS — Z113 Encounter for screening for infections with a predominantly sexual mode of transmission: Secondary | ICD-10-CM

## 2021-09-14 DIAGNOSIS — B3731 Acute candidiasis of vulva and vagina: Secondary | ICD-10-CM | POA: Diagnosis not present

## 2021-09-14 NOTE — Progress Notes (Signed)
SUBJECTIVE:  26 y.o. female complains of clear, malodorous, and thin vaginal discharge for 1 week(s). Denies abnormal vaginal bleeding or significant pelvic pain or fever. No UTI symptoms. Denies history of known exposure to STD. Reports symptoms reoccur often after menses.  08/30/21 LMP  OBJECTIVE:  She appears well, afebrile. Urine dipstick: not done.  ASSESSMENT:  Vaginal Discharge  Vaginal Odor   PLAN:  GC, chlamydia, trichomonas, BVAG, CVAG probe sent to lab. Treatment: To be determined once lab results are received ROV prn if symptoms persist or worsen. Recommend follow up for recurrent infection.

## 2021-09-15 LAB — CERVICOVAGINAL ANCILLARY ONLY
Bacterial Vaginitis (gardnerella): NEGATIVE
Candida Glabrata: NEGATIVE
Candida Vaginitis: POSITIVE — AB
Chlamydia: NEGATIVE
Comment: NEGATIVE
Comment: NEGATIVE
Comment: NEGATIVE
Comment: NEGATIVE
Comment: NEGATIVE
Comment: NORMAL
Neisseria Gonorrhea: NEGATIVE
Trichomonas: NEGATIVE

## 2021-09-17 MED ORDER — FLUCONAZOLE 150 MG PO TABS: 150.0000 mg | ORAL_TABLET | Freq: Once | ORAL | 0 refills | Status: AC

## 2021-09-17 NOTE — Addendum Note (Signed)
Addended by: Catalina Antigua on: 09/17/2021 11:53 AM   Modules accepted: Orders

## 2021-09-29 ENCOUNTER — Other Ambulatory Visit: Payer: Self-pay

## 2021-09-29 ENCOUNTER — Ambulatory Visit (INDEPENDENT_AMBULATORY_CARE_PROVIDER_SITE_OTHER): Payer: BC Managed Care – PPO | Admitting: Obstetrics & Gynecology

## 2021-09-29 VITALS — BP 121/76 | HR 74 | Wt 177.0 lb

## 2021-09-29 DIAGNOSIS — R102 Pelvic and perineal pain: Secondary | ICD-10-CM | POA: Diagnosis not present

## 2021-09-29 DIAGNOSIS — N898 Other specified noninflammatory disorders of vagina: Secondary | ICD-10-CM | POA: Diagnosis not present

## 2021-09-29 DIAGNOSIS — B379 Candidiasis, unspecified: Secondary | ICD-10-CM | POA: Diagnosis not present

## 2021-09-29 DIAGNOSIS — Z30019 Encounter for initial prescription of contraceptives, unspecified: Secondary | ICD-10-CM

## 2021-09-29 MED ORDER — FLUCONAZOLE 150 MG PO TABS: ORAL_TABLET | ORAL | 0 refills | Status: DC

## 2021-09-29 MED ORDER — NORETHIN ACE-ETH ESTRAD-FE 1-20 MG-MCG(24) PO TABS: 1.0000 | ORAL_TABLET | Freq: Every day | ORAL | 11 refills | Status: DC

## 2021-09-29 NOTE — Progress Notes (Signed)
Pt states she is having some pain in left ovary area, ? Cyst.  Pt would like to discuss chronic BV/Yeast - positive for yeast on 1/9 treated with Diflucan- pt still having symptoms.

## 2021-09-29 NOTE — Progress Notes (Signed)
Patient ID: Megan Hansen, female   DOB: 13-Aug-1996, 26 y.o.   MRN: [id number]  Chief Complaint  Patient presents with   Gynecologic Exam   LLQ pain and vaginal itching HPI Megan Hansen is a 26 y.o. female.  G1P1001 Patient's last menstrual period was 08/30/2021. She was treated for yeast with one dose of diflucan and her sx have returned with vaginal itching. She reports LLQ pain with menses and occasionally with no bleeding. Her Rx for LoLoestrin was not filled due to cost. She would like another OCP HPI  No past medical history on file.  Past Surgical History:  Procedure Laterality Date   CESAREAN SECTION  04/01/2018   fibroidectomy Bilateral    Breast    No family history on file.  Social History Social History   Tobacco Use   Smoking status: Never   Smokeless tobacco: Never  Vaping Use   Vaping Use: Never used  Substance Use Topics   Alcohol use: Not Currently   Drug use: Yes    Types: Marijuana    Not on File  Current Outpatient Medications  Medication Sig Dispense Refill   Norethindrone Acetate-Ethinyl Estrad-FE (LOESTRIN 24 FE) 1-20 MG-MCG(24) tablet Take 1 tablet by mouth daily. 28 tablet 11   fluconazole (DIFLUCAN) 150 MG tablet 1 tablet by mouth and repeat in 3 days 2 tablet 0   LO LOESTRIN FE 1 MG-10 MCG / 10 MCG tablet Take 1 tablet by mouth daily. (Patient not taking: Reported on 09/29/2021) 28 tablet 11   ondansetron (ZOFRAN ODT) 4 MG disintegrating tablet Take 1 tablet (4 mg total) by mouth every 8 (eight) hours as needed for nausea or vomiting. (Patient not taking: Reported on 09/29/2021) 20 tablet 0   No current facility-administered medications for this visit.    Review of Systems Review of Systems  Constitutional: Negative.   Respiratory: Negative.    Cardiovascular: Negative.   Genitourinary:  Positive for menstrual problem, pelvic pain and vaginal discharge.   Blood pressure 121/76, pulse 74, weight 177 lb (80.3 kg), last menstrual period  08/30/2021.  Physical Exam Physical Exam Vitals and nursing note reviewed. Exam conducted with a chaperone present.  Constitutional:      Appearance: Normal appearance.  Pulmonary:     Effort: Pulmonary effort is normal.  Genitourinary:    General: Normal vulva.     Exam position: Lithotomy position.     Vagina: Vaginal discharge (white) present.     Cervix: Normal.     Uterus: Normal.      Adnexa: Right adnexa normal and left adnexa normal.  Neurological:     Mental Status: She is alert.    Data Reviewed Cytology result  Assessment Pelvic pain most c/w dysmenorrhea Yeast infection  Pelvic pain in female  Encounter for female birth control - Plan: Norethindrone Acetate-Ethinyl Estrad-FE (LOESTRIN 24 FE) 1-20 MG-MCG(24) tablet  Vaginal discharge - Plan: fluconazole (DIFLUCAN) 150 MG tablet   Plan Meds ordered this encounter  Medications   Norethindrone Acetate-Ethinyl Estrad-FE (LOESTRIN 24 FE) 1-20 MG-MCG(24) tablet    Sig: Take 1 tablet by mouth daily.    Dispense:  28 tablet    Refill:  11   fluconazole (DIFLUCAN) 150 MG tablet    Sig: 1 tablet by mouth and repeat in 3 days    Dispense:  2 tablet    Refill:  0       Scheryl Darter 09/29/2021, 4:43 PM

## 2021-10-08 ENCOUNTER — Other Ambulatory Visit: Payer: Self-pay

## 2021-10-08 ENCOUNTER — Encounter: Payer: Self-pay | Admitting: Obstetrics

## 2021-10-08 ENCOUNTER — Ambulatory Visit (INDEPENDENT_AMBULATORY_CARE_PROVIDER_SITE_OTHER): Payer: BC Managed Care – PPO | Admitting: Obstetrics

## 2021-10-08 ENCOUNTER — Other Ambulatory Visit (HOSPITAL_COMMUNITY)
Admission: RE | Admit: 2021-10-08 | Discharge: 2021-10-08 | Disposition: A | Discharge status: Home / Self Care | Payer: BC Managed Care – PPO | Source: Ambulatory Visit | Attending: Obstetrics | Admitting: Obstetrics

## 2021-10-08 VITALS — BP 120/79 | HR 69 | Ht 66.0 in | Wt 177.4 lb

## 2021-10-08 DIAGNOSIS — B379 Candidiasis, unspecified: Secondary | ICD-10-CM

## 2021-10-08 DIAGNOSIS — N898 Other specified noninflammatory disorders of vagina: Secondary | ICD-10-CM

## 2021-10-08 DIAGNOSIS — B9689 Other specified bacterial agents as the cause of diseases classified elsewhere: Secondary | ICD-10-CM

## 2021-10-08 DIAGNOSIS — N760 Acute vaginitis: Secondary | ICD-10-CM | POA: Diagnosis not present

## 2021-10-08 DIAGNOSIS — R102 Pelvic and perineal pain: Secondary | ICD-10-CM | POA: Diagnosis not present

## 2021-10-08 MED ORDER — METRONIDAZOLE 500 MG PO TABS: 500.0000 mg | ORAL_TABLET | Freq: Two times a day (BID) | ORAL | 6 refills | Status: DC

## 2021-10-08 MED ORDER — FLUCONAZOLE 200 MG PO TABS: 200.0000 mg | ORAL_TABLET | ORAL | 2 refills | Status: AC

## 2021-10-08 MED ORDER — IBUPROFEN 800 MG PO TABS: 800.0000 mg | ORAL_TABLET | Freq: Three times a day (TID) | ORAL | 5 refills | Status: AC | PRN

## 2021-10-08 NOTE — Progress Notes (Signed)
Patient ID: Megan Hansen, female   DOB: September 28, 1995, 26 y.o.   MRN: [id number]  No chief complaint on file.   HPI Megan Hansen is a 26 y.o. female.  Complains of vaginal discharge with irritation. HPI  History reviewed. No pertinent past medical history.  Past Surgical History:  Procedure Laterality Date   CESAREAN SECTION  04/01/2018   fibroidectomy Bilateral    Breast    History reviewed. No pertinent family history.  Social History Social History   Tobacco Use   Smoking status: Never   Smokeless tobacco: Never  Vaping Use   Vaping Use: Never used  Substance Use Topics   Alcohol use: Not Currently   Drug use: Yes    Types: Marijuana    Comment: sometimes    Not on File  Current Outpatient Medications  Medication Sig Dispense Refill   fluconazole (DIFLUCAN) 200 MG tablet Take 1 tablet (200 mg total) by mouth every 3 (three) days. 3 tablet 2   ibuprofen (ADVIL) 800 MG tablet Take 1 tablet (800 mg total) by mouth every 8 (eight) hours as needed. 30 tablet 5   metroNIDAZOLE (FLAGYL) 500 MG tablet Take 1 tablet (500 mg total) by mouth 2 (two) times daily. 14 tablet 6   Norethindrone Acetate-Ethinyl Estrad-FE (LOESTRIN 24 FE) 1-20 MG-MCG(24) tablet Take 1 tablet by mouth daily. (Patient not taking: Reported on 10/08/2021) 28 tablet 11   ondansetron (ZOFRAN ODT) 4 MG disintegrating tablet Take 1 tablet (4 mg total) by mouth every 8 (eight) hours as needed for nausea or vomiting. (Patient not taking: Reported on 09/29/2021) 20 tablet 0   No current facility-administered medications for this visit.    Review of Systems Review of Systems Constitutional: negative for fatigue and weight loss Respiratory: negative for cough and wheezing Cardiovascular: negative for chest pain, fatigue and palpitations Gastrointestinal: negative for abdominal pain and change in bowel habits Genitourinary: positive for vaginal discharge with irrritation Integument/breast: negative for nipple  discharge Musculoskeletal:negative for myalgias Neurological: negative for gait problems and tremors Behavioral/Psych: negative for abusive relationship, depression Endocrine: negative for temperature intolerance      Blood pressure 120/79, pulse 69, height 5\' 6"  (1.676 m), weight 177 lb 6.4 oz (80.5 kg).  Physical Exam Physical Exam General:   Alert and no distress  Skin:   no rash or abnormalities  Lungs:   clear to auscultation bilaterally  Heart:   regular rate and rhythm, S1, S2 normal, no murmur, click, rub or gallop  Breasts:   Not examined  Abdomen:  normal findings: no organomegaly, soft, non-tender and no hernia  Pelvis:  External genitalia: normal general appearance Urinary system: urethral meatus normal and bladder without fullness, nontender Vaginal: normal without tenderness, induration or masses Cervix: normal appearance Adnexa: normal bimanual exam Uterus: anteverted and non-tender, normal size    .I have spent a total of 20 minutes of face-to-face  time, excluding clinical staff time, reviewing notes and preparing tosee patient, ordering tests and/or medications, and counseling the patient.   Data Reviewed Wet Prep  Assessment     1. Vaginal discharge Rx: - Cervicovaginal ancillary only( [facility])  2. BV (bacterial vaginosis) Rx: - metroNIDAZOLE (FLAGYL) 500 MG tablet; Take 1 tablet (500 mg total) by mouth 2 (two) times daily.  Dispense: 14 tablet; Refill: 6  3. Candida infection Rx: - fluconazole (DIFLUCAN) 200 MG tablet; Take 1 tablet (200 mg total) by mouth every 3 (three) days.  Dispense: 3 tablet; Refill: 2  4. Pelvic pain  Rx: - US PELVIC COMPLETE WITH TRANSVAGINAL; Future - ibuprofen (ADVIL) 800 MG tablet; Take 1 tablet (800 mg total) by mouth every 8 (eight) hours as needed.  Dispense: 30 tablet; Refill: 5     Plan    Orders Placed This Encounter  Procedures   US PELVIC COMPLETE WITH TRANSVAGINAL    Standing Status:   Future    Standing  Expiration Date:   10/08/2022    Order Specific Question:   Reason for Exam (SYMPTOM  OR DIAGNOSIS REQUIRED)    Answer:   Pelvic pain, LLQ.    Order Specific Question:   Preferred imaging location?    Answer:   [facility] Ultrasound   Meds ordered this encounter  Medications   fluconazole (DIFLUCAN) 200 MG tablet    Sig: Take 1 tablet (200 mg total) by mouth every 3 (three) days.    Dispense:  3 tablet    Refill:  2   metroNIDAZOLE (FLAGYL) 500 MG tablet    Sig: Take 1 tablet (500 mg total) by mouth 2 (two) times daily.    Dispense:  14 tablet    Refill:  6   ibuprofen (ADVIL) 800 MG tablet    Sig: Take 1 tablet (800 mg total) by mouth every 8 (eight) hours as needed.    Dispense:  30 tablet    Refill:  5      Brock Bad, MD 10/08/2021 4:45 PM

## 2021-10-08 NOTE — Progress Notes (Signed)
Patient presents for vaginal swab for yeast, bv, and STD. She does complains of having a little discharge with sight odor. She would also like to follow up on ovarian cysts. She is having lower abdominal pain on the left only. She states that she was told that she had cysts when she was 26 yo. She has not had it checked since then.  Last pap: 07/2021 Normal

## 2021-10-09 LAB — CERVICOVAGINAL ANCILLARY ONLY
Bacterial Vaginitis (gardnerella): POSITIVE — AB
Candida Glabrata: NEGATIVE
Candida Vaginitis: NEGATIVE
Chlamydia: NEGATIVE
Comment: NEGATIVE
Comment: NEGATIVE
Comment: NEGATIVE
Comment: NEGATIVE
Comment: NEGATIVE
Comment: NORMAL
Neisseria Gonorrhea: NEGATIVE
Trichomonas: NEGATIVE

## 2021-10-12 ENCOUNTER — Other Ambulatory Visit: Payer: Self-pay | Admitting: Obstetrics

## 2021-10-14 ENCOUNTER — Other Ambulatory Visit: Payer: BC Managed Care – PPO

## 2021-10-21 ENCOUNTER — Ambulatory Visit: Admission: RE | Admit: 2021-10-21 | Payer: BC Managed Care – PPO | Source: Ambulatory Visit

## 2022-01-29 ENCOUNTER — Encounter (HOSPITAL_COMMUNITY): Payer: Self-pay

## 2022-01-29 ENCOUNTER — Other Ambulatory Visit: Payer: Self-pay

## 2022-01-29 ENCOUNTER — Inpatient Hospital Stay (HOSPITAL_COMMUNITY): Payer: BC Managed Care – PPO

## 2022-01-29 ENCOUNTER — Inpatient Hospital Stay (HOSPITAL_COMMUNITY)
Admission: AD | Admit: 2022-01-29 | Discharge: 2022-01-29 | Disposition: A | Discharge status: Home / Self Care | Payer: BC Managed Care – PPO | Attending: Obstetrics and Gynecology | Admitting: Obstetrics and Gynecology

## 2022-01-29 DIAGNOSIS — O209 Hemorrhage in early pregnancy, unspecified: Secondary | ICD-10-CM | POA: Diagnosis not present

## 2022-01-29 DIAGNOSIS — N939 Abnormal uterine and vaginal bleeding, unspecified: Secondary | ICD-10-CM | POA: Diagnosis not present

## 2022-01-29 DIAGNOSIS — R102 Pelvic and perineal pain: Secondary | ICD-10-CM | POA: Insufficient documentation

## 2022-01-29 DIAGNOSIS — R1032 Left lower quadrant pain: Secondary | ICD-10-CM | POA: Diagnosis not present

## 2022-01-29 DIAGNOSIS — G8918 Other acute postprocedural pain: Secondary | ICD-10-CM | POA: Insufficient documentation

## 2022-01-29 DIAGNOSIS — Z3A00 Weeks of gestation of pregnancy not specified: Secondary | ICD-10-CM | POA: Diagnosis not present

## 2022-01-29 DIAGNOSIS — R1084 Generalized abdominal pain: Secondary | ICD-10-CM | POA: Diagnosis not present

## 2022-01-29 LAB — TYPE AND SCREEN
ABO/RH(D): A POS
Antibody Screen: NEGATIVE

## 2022-01-29 LAB — CBC
HCT: 38 % (ref 36.0–46.0)
Hemoglobin: 12.4 g/dL (ref 12.0–15.0)
MCH: 29.2 pg (ref 26.0–34.0)
MCHC: 32.6 g/dL (ref 30.0–36.0)
MCV: 89.6 fL (ref 80.0–100.0)
Platelets: 260 10*3/uL (ref 150–400)
RBC: 4.24 MIL/uL (ref 3.87–5.11)
RDW: 14.5 % (ref 11.5–15.5)
WBC: 12.4 10*3/uL — ABNORMAL HIGH (ref 4.0–10.5)
nRBC: 0 % (ref 0.0–0.2)

## 2022-01-29 MED ORDER — TRAMADOL HCL 50 MG PO TABS: 50.0000 mg | ORAL_TABLET | Freq: Four times a day (QID) | ORAL | 0 refills | Status: AC | PRN

## 2022-01-29 MED ORDER — HYDROMORPHONE HCL 1 MG/ML IJ SOLN
1.0000 mg | Freq: Once | INTRAMUSCULAR | Status: AC
  Administered 2022-01-29: 1 mg via INTRAMUSCULAR
  Filled 2022-01-29: qty 1

## 2022-01-29 NOTE — MAU Note (Signed)
Megan Hansen is a 26 y.o. at [redacted]w[redacted]d here in MAU reporting: pt arrived via EMS reporting that she had a termination on Monday in [place]. States they did a D&C. Started having left sided pain yesterday and was worse this morning. Took some Aleeve around 0745 and she is having minimal relief. Also been bleeding like a period, no clots. Changing a pad twice per day.  LMP: 12/07/21  Onset of complaint: yesterday  Pain score: 8/10  Vitals:   01/29/22 0858 01/29/22 0900  BP: (!) 127/91   Pulse: (!) 49 (!) 57  Resp: 18   Temp: 97.8 F (36.6 C)   SpO2: 100%      Lab orders placed from triage: none

## 2022-01-29 NOTE — Discharge Instructions (Signed)
[facility]   [facility]             [address], [place], [place] [address] [phone]  [facility]                                                             [address], [address], [place], [place], [address] [phone]  [facility]                                    [address], [address], [place], [place], [address] [phone]  [facility] [place] [address], [address], [place], [place], [address] [phone]  [facility]                                 [address], [place], [place], [address] [phone]  [facility]                                    [address], [place], [place], [address] [phone]  [facility] [address], [address], [place], [place], [address]                              [facility] [address], [address], [place], , [address] [phone]  [facility]         Phone: [phone]  [facility]      Phone: [phone]   [facility]      Phone: [phone]  [facility]         Phone: [phone]   [facility]    Phone: [phone]  [facility]      Phone: [phone]  [facility]     Phone: [phone]  [facility]        Phone: [phone]  [facility]      Phone: [phone]  

## 2022-01-29 NOTE — MAU Provider Note (Signed)
Chief Complaint: Abdominal Pain and Vaginal Bleeding   Event Date/Time   First Provider Initiated Contact with Patient 01/29/22 0909      SUBJECTIVE HPI: Megan Hansen is a 26 y.o. G3P1011 at [redacted]w[redacted]d by LMP who presents to maternity admissions reporting onset of LLQ pain yesterday following a pregnancy termination by [facility] on Monday in [place].  She reports she had minimal pain following the surgery and her pain was well controlled with naproxen, but then yesterday started having more pain in the LLQ that has gradually worsened to severe pain this morning.  She took naproxen this morning but it has not helped.  She has hx ovarian cysts and is concerned about that as a possible cause of her symptoms.  She reports light bleeding and denies any fever, chills, or n/v.     History reviewed. No pertinent past medical history. Past Surgical History:  Procedure Laterality Date   CESAREAN SECTION  04/01/2018   DILATION AND CURETTAGE OF UTERUS     fibroidectomy Bilateral    Breast   Social History   Socioeconomic History   Marital status: Single    Spouse name: Not on file   Number of children: Not on file   Years of education: Not on file   Highest education level: Not on file  Occupational History   Not on file  Tobacco Use   Smoking status: Never   Smokeless tobacco: Never  Vaping Use   Vaping Use: Never used  Substance and Sexual Activity   Alcohol use: Not Currently    Comment: socially   Drug use: Not Currently    Types: Marijuana    Comment: sometimes   Sexual activity: Yes    Partners: Male    Birth control/protection: Condom  Other Topics Concern   Not on file  Social History Narrative   Not on file   Social Determinants of Health   Financial Resource Strain: Not on file  Food Insecurity: Not on file  Transportation Needs: Not on file  Physical Activity: Not on file  Stress: Not on file  Social Connections: Not on file  Intimate Partner Violence: Not on file   No  current facility-administered medications on file prior to encounter.   Current Outpatient Medications on File Prior to Encounter  Medication Sig Dispense Refill   fluconazole (DIFLUCAN) 200 MG tablet Take 1 tablet (200 mg total) by mouth every 3 (three) days. 3 tablet 2   ibuprofen (ADVIL) 800 MG tablet Take 1 tablet (800 mg total) by mouth every 8 (eight) hours as needed. 30 tablet 5   metroNIDAZOLE (FLAGYL) 500 MG tablet Take 1 tablet (500 mg total) by mouth 2 (two) times daily. 14 tablet 6   No Known Allergies  ROS:  Review of Systems  Constitutional:  Negative for chills and fever.  Respiratory:  Negative for cough and shortness of breath.   Cardiovascular:  Negative for chest pain.  Gastrointestinal:  Positive for abdominal pain. Negative for nausea and vomiting.  Genitourinary:  Negative for dysuria, frequency and urgency.  Musculoskeletal: Negative.   Neurological:  Negative for dizziness and headaches.    I have reviewed patient's Past Medical Hx, Surgical Hx, Family Hx, Social Hx, medications and allergies.   Physical Exam  Patient Vitals for the past 24 hrs:  BP Temp Temp src Pulse Resp SpO2  01/29/22 1113 121/89 -- -- (!) 56 -- --  01/29/22 0905 -- -- -- 61 -- 100 %  01/29/22 0900 -- -- -- Marland Kitchen  57 -- --  01/29/22 0858 (!) 127/91 97.8 F (36.6 C) Oral (!) 49 18 100 %   Constitutional: Well-developed, well-nourished female in no acute distress.  Cardiovascular: normal rate Respiratory: normal effort GI: Abd soft, non-tender. Pos BS x 4 MS: Extremities nontender, no edema, normal ROM Neurologic: Alert and oriented x 4.  GU: Neg CVAT.  PELVIC EXAM: Cervix pink, visually closed, without lesion, scant white creamy discharge, vaginal walls and external genitalia normal Bimanual exam: Cervix 0/long/high, firm, anterior, neg CMT, uterus nontender, nonenlarged, adnexa without tenderness, enlargement, or mass    LAB RESULTS Results for orders placed or performed during  the hospital encounter of 01/29/22 (from the past 24 hour(s))  CBC     Status: Abnormal   Collection Time: 01/29/22  9:17 AM  Result Value Ref Range   WBC 12.4 (H) 4.0 - 10.5 K/uL   RBC 4.24 3.87 - 5.11 MIL/uL   Hemoglobin 12.4 12.0 - 15.0 g/dL   HCT 38.0 36.0 - 46.0 %   MCV 89.6 80.0 - 100.0 fL   MCH 29.2 26.0 - 34.0 pg   MCHC 32.6 30.0 - 36.0 g/dL   RDW 14.5 11.5 - 15.5 %   Platelets 260 150 - 400 K/uL   nRBC 0.0 0.0 - 0.2 %  Type and screen     Status: None   Collection Time: 01/29/22  9:17 AM  Result Value Ref Range   ABO/RH(D) A POS    Antibody Screen NEG    Sample Expiration      02/01/2022,2359 Performed at [facility], [place] [address] [place], [place] [address]     --/--/A POS (05/26 [id number])  IMAGING US OB LESS THAN 14 WEEKS WITH OB TRANSVAGINAL  Result Date: 01/29/2022 CLINICAL DATA:  Vaginal bleeding with left lower quadrant pain for 1 day. Status post D and C. EXAM: OBSTETRIC <14 WK [place] AND TRANSVAGINAL OB US TECHNIQUE: Both transabdominal and transvaginal ultrasound examinations were performed for complete evaluation of the gestation as well as the maternal uterus, adnexal regions, and pelvic cul-de-sac. Transvaginal technique was performed to assess early pregnancy. COMPARISON:  None Available. FINDINGS: No intrauterine gestational sac. Endometrium measures 8 mm in thickness. Mobile complex contents in the endometrial canal suggest blood products. No color signal within the endometrium on Doppler evaluation. Maternal uterus/adnexae: Right ovary measures 2.4 x 1.9 x 1.7 cm. Left ovary measures 2.5 x 1.8 x 1.7 cm. No adnexal mass. Small volume free fluid seen in the cul-de-sac. IMPRESSION: No evidence for retained products of conception. Electronically Signed   By: Misty Stanley M.D.   On: 01/29/2022 10:34    MAU Management/MDM: Orders Placed This Encounter  Procedures   US OB LESS THAN 14 WEEKS WITH OB TRANSVAGINAL   CBC   Type and screen   Discharge patient     Meds ordered this encounter  Medications   HYDROmorphone (DILAUDID) injection 1 mg   traMADol (ULTRAM) 50 MG tablet    Sig: Take 1-2 tablets (50-100 mg total) by mouth every 6 (six) hours as needed.    Dispense:  10 tablet    Refill:  0    Order Specific Question:   Supervising Provider    Answer:   ERVIN, MICHAEL L [[id number]]    [place] with normal ovaries and no evidence of retained products. Pt with good pain control with Dilaudid IM in MAU. Rx for Tramadol for PRN use. Pt to f/u with her OB/Gyn provider, return to MAU with any  emergencies.    ASSESSMENT 1. Postoperative pain   2. Acute pelvic pain, female     PLAN Discharge home Allergies as of 01/29/2022   No Known Allergies      Medication List     STOP taking these medications    Norethindrone Acetate-Ethinyl Estrad-FE 1-20 MG-MCG(24) tablet Commonly known as: LOESTRIN 24 FE   ondansetron 4 MG disintegrating tablet Commonly known as: Zofran ODT       TAKE these medications    fluconazole 200 MG tablet Commonly known as: Diflucan Take 1 tablet (200 mg total) by mouth every 3 (three) days.   ibuprofen 800 MG tablet Commonly known as: ADVIL Take 1 tablet (800 mg total) by mouth every 8 (eight) hours as needed.   metroNIDAZOLE 500 MG tablet Commonly known as: FLAGYL Take 1 tablet (500 mg total) by mouth 2 (two) times daily.   traMADol 50 MG tablet Commonly known as: ULTRAM Take 1-2 tablets (50-100 mg total) by mouth every 6 (six) hours as needed.        Follow-up Information     Your Gyn provider Follow up.   Why: As scheduled  Or see list provided        [facility] Follow up.   Specialty: Obstetrics and Gynecology Why: As needed for emergencies Contact information: [address] [id number] [place] Russell Certified Nurse-Midwife 01/29/2022  8:10 PM

## 2022-02-08 ENCOUNTER — Ambulatory Visit: Admission: RE | Admit: 2022-02-08 | Payer: BC Managed Care – PPO | Source: Ambulatory Visit

## 2022-02-11 ENCOUNTER — Encounter (HOSPITAL_BASED_OUTPATIENT_CLINIC_OR_DEPARTMENT_OTHER): Payer: Self-pay

## 2022-02-11 ENCOUNTER — Emergency Department (HOSPITAL_BASED_OUTPATIENT_CLINIC_OR_DEPARTMENT_OTHER): Payer: BC Managed Care – PPO | Admitting: Radiology

## 2022-02-11 ENCOUNTER — Other Ambulatory Visit: Payer: Self-pay

## 2022-02-11 ENCOUNTER — Emergency Department (HOSPITAL_BASED_OUTPATIENT_CLINIC_OR_DEPARTMENT_OTHER)
Admission: EM | Admit: 2022-02-11 | Discharge: 2022-02-11 | Disposition: A | Discharge status: Home / Self Care | Payer: BC Managed Care – PPO | Attending: Emergency Medicine | Admitting: Emergency Medicine

## 2022-02-11 DIAGNOSIS — R0602 Shortness of breath: Secondary | ICD-10-CM | POA: Insufficient documentation

## 2022-02-11 DIAGNOSIS — R079 Chest pain, unspecified: Secondary | ICD-10-CM | POA: Diagnosis not present

## 2022-02-11 DIAGNOSIS — R0789 Other chest pain: Secondary | ICD-10-CM | POA: Diagnosis not present

## 2022-02-11 LAB — BASIC METABOLIC PANEL
Anion gap: 11 (ref 5–15)
BUN: 8 mg/dL (ref 6–20)
CO2: 26 mmol/L (ref 22–32)
Calcium: 9.8 mg/dL (ref 8.9–10.3)
Chloride: 101 mmol/L (ref 98–111)
Creatinine, Ser: 0.67 mg/dL (ref 0.44–1.00)
GFR, Estimated: >60 mL/min (ref >60)
Glucose, Bld: 98 mg/dL (ref 70–99)
Potassium: 3.5 mmol/L (ref 3.5–5.1)
Sodium: 138 mmol/L (ref 135–145)

## 2022-02-11 LAB — CBC
HCT: 41.6 % (ref 36.0–46.0)
Hemoglobin: 13.4 g/dL (ref 12.0–15.0)
MCH: 28.5 pg (ref 26.0–34.0)
MCHC: 32.2 g/dL (ref 30.0–36.0)
MCV: 88.3 fL (ref 80.0–100.0)
Platelets: 262 10*3/uL (ref 150–400)
RBC: 4.71 MIL/uL (ref 3.87–5.11)
RDW: 14.5 % (ref 11.5–15.5)
WBC: 8.3 10*3/uL (ref 4.0–10.5)
nRBC: 0 % (ref 0.0–0.2)

## 2022-02-11 LAB — D-DIMER, QUANTITATIVE: D-Dimer, Quant: 0.3 ug/mL-FEU (ref 0.00–0.50)

## 2022-02-11 LAB — TROPONIN I (HIGH SENSITIVITY): Troponin I (High Sensitivity): <2 ng/L (ref <18)

## 2022-02-11 NOTE — ED Triage Notes (Signed)
Pt c/o sudden onset of chest pain that started about 1 hour ago. She reports she has been having some difficulty breathing since waking up this morning reports she has been under a lot of stress the past couple of weeks.

## 2022-02-11 NOTE — ED Provider Notes (Signed)
[facility] EMERGENCY DEPT Provider Note   CSN: [id number] Arrival date & time: 02/11/22  1044     History  Chief Complaint  Patient presents with   Chest Pain    Megan Hansen is a 26 y.o. female.  Presented to ER due to concern for chest pain.  Patient states that she had chest pain that started this morning, shortness of breath.  Pain worse with taking deep breath.  Has been under significant stress lately.  Underwent termination via D&C in [place] about three weeks ago. Had gone to MAU on 5/26 with some pain and light bleeding - [place] with no evidence of retained products.   HPI     Home Medications Prior to Admission medications   Medication Sig Start Date End Date Taking? Authorizing Provider  fluconazole (DIFLUCAN) 200 MG tablet Take 1 tablet (200 mg total) by mouth every 3 (three) days. 10/08/21   Shelly Bombard, MD  ibuprofen (ADVIL) 800 MG tablet Take 1 tablet (800 mg total) by mouth every 8 (eight) hours as needed. 10/08/21   Shelly Bombard, MD  metroNIDAZOLE (FLAGYL) 500 MG tablet Take 1 tablet (500 mg total) by mouth 2 (two) times daily. 10/08/21   Shelly Bombard, MD  traMADol (ULTRAM) 50 MG tablet Take 1-2 tablets (50-100 mg total) by mouth every 6 (six) hours as needed. 01/29/22   Leftwich-Kirby, Kathie Dike, CNM      Allergies    Patient has no known allergies.    Review of Systems   Review of Systems  Constitutional:  Negative for chills and fever.  HENT:  Negative for ear pain and sore throat.   Eyes:  Negative for pain and visual disturbance.  Respiratory:  Positive for shortness of breath. Negative for cough.   Cardiovascular:  Positive for chest pain. Negative for palpitations.  Gastrointestinal:  Negative for abdominal pain and vomiting.  Genitourinary:  Negative for dysuria and hematuria.  Musculoskeletal:  Negative for arthralgias and back pain.  Skin:  Negative for color change and rash.  Neurological:  Negative for seizures and syncope.   All other systems reviewed and are negative.   Physical Exam Updated Vital Signs BP (!) 131/91 (BP Location: Right Arm)   Pulse 60   Temp 98.2 F (36.8 C)   Resp 15   Ht 5\' 6"  (1.676 m)   Wt 81.6 kg   LMP 12/07/2021   SpO2 100%   BMI 29.05 kg/m  Physical Exam Vitals and nursing note reviewed.  Constitutional:      General: She is not in acute distress.    Appearance: She is well-developed.  HENT:     Head: Normocephalic and atraumatic.  Eyes:     Conjunctiva/sclera: Conjunctivae normal.  Cardiovascular:     Rate and Rhythm: Normal rate and regular rhythm.     Heart sounds: No murmur heard. Pulmonary:     Effort: Pulmonary effort is normal. No respiratory distress.     Breath sounds: Normal breath sounds.  Abdominal:     Palpations: Abdomen is soft.     Tenderness: There is no abdominal tenderness.  Musculoskeletal:        General: No swelling.     Cervical back: Neck supple.  Skin:    General: Skin is warm and dry.     Capillary Refill: Capillary refill takes less than 2 seconds.  Neurological:     Mental Status: She is alert.  Psychiatric:        Mood  and Affect: Mood normal.     ED Results / Procedures / Treatments   Labs (all labs ordered are listed, but only abnormal results are displayed) Labs Reviewed  BASIC METABOLIC PANEL  CBC  D-DIMER, QUANTITATIVE  TROPONIN I (HIGH SENSITIVITY)    EKG EKG Interpretation  Date/Time:  Thursday February 11 2022 10:54:24 EDT Ventricular Rate:  84 PR Interval:  124 QRS Duration: 72 QT Interval:  378 QTC Calculation: 446 R Axis:   39 Text Interpretation: Normal sinus rhythm with sinus arrhythmia Normal ECG No previous ECGs available Confirmed by Madalyn Rob [phone]) on 02/11/2022 11:36:32 AM  Radiology DG Chest 2 View  Result Date: 02/11/2022 CLINICAL DATA:  Shortness of breath. EXAM: CHEST - 2 VIEW COMPARISON:  None Available. FINDINGS: No consolidation. No visible pleural effusions or pneumothorax.  Cardiomediastinal silhouette is within normal limits. No displaced fracture. Mild S-shaped thoracic curvature. IMPRESSION: No evidence of acute cardiopulmonary disease. Electronically Signed   By: Margaretha Sheffield M.D.   On: 02/11/2022 11:23    Procedures Procedures    Medications Ordered in ED Medications - No data to display  ED Course/ Medical Decision Making/ A&P                           Medical Decision Making Amount and/or Complexity of Data Reviewed Labs: ordered. Radiology: ordered.   26 year old presents to ER due to concern for chest pain.  On exam she appears well in no acute distress.  EKG without acute ischemic change, troponin undetectable, doubt ACS.  D-dimer within normal limits, doubt pulmonary embolism.  CXR is within normal limits.  I independently reviewed and interpreted.  No cardiomegaly.  No pneumothorax, no effusion.  No infiltrate.  On reassessment patient denies ongoing symptoms.  Given work-up, well appearance and lack of ongoing symptoms, we will go and discharge patient home.  Additional history obtained from review of chart, recent visit with MAU, patient had D&C about 3 weeks ago.  Ultrasound during MAU visit was reassuring.  Patient denies any new abdominal pelvic or vaginal complaints today.    After the discussed management above, the patient was determined to be safe for discharge.  The patient was in agreement with this plan and all questions regarding their care were answered.  ED return precautions were discussed and the patient will return to the ED with any significant worsening of condition.         Final Clinical Impression(s) / ED Diagnoses Final diagnoses:  Chest pain, unspecified type    Rx / DC Orders ED Discharge Orders     None         Lucrezia Starch, MD 02/12/22 [phone]

## 2022-02-11 NOTE — Discharge Instructions (Addendum)
Come back to ER if you are having worsening chest pain, difficulty breathing or other new concerning symptom.

## 2022-04-08 ENCOUNTER — Ambulatory Visit (INDEPENDENT_AMBULATORY_CARE_PROVIDER_SITE_OTHER): Payer: BC Managed Care – PPO | Admitting: Emergency Medicine

## 2022-04-08 ENCOUNTER — Other Ambulatory Visit (HOSPITAL_COMMUNITY)
Admission: RE | Admit: 2022-04-08 | Discharge: 2022-04-08 | Disposition: A | Discharge status: Home / Self Care | Payer: BC Managed Care – PPO | Source: Ambulatory Visit | Attending: Obstetrics & Gynecology | Admitting: Obstetrics & Gynecology

## 2022-04-08 VITALS — BP 116/76 | HR 56 | Wt 173.5 lb

## 2022-04-08 DIAGNOSIS — N898 Other specified noninflammatory disorders of vagina: Secondary | ICD-10-CM | POA: Diagnosis not present

## 2022-04-08 DIAGNOSIS — Z113 Encounter for screening for infections with a predominantly sexual mode of transmission: Secondary | ICD-10-CM

## 2022-04-08 NOTE — Progress Notes (Signed)
SUBJECTIVE:  26 y.o. female complains of clear vaginal discharge for 5 day(s). Denies abnormal vaginal bleeding or significant pelvic pain or fever. No UTI symptoms. Denies history of known exposure to STD.  Patient's last menstrual period was 12/07/2021.  OBJECTIVE:  She appears well, afebrile. Urine dipstick: not done.  ASSESSMENT:  Vaginal Discharge  Vaginal Odor Vaginal itching   PLAN:  GC, chlamydia, trichomonas, BVAG, CVAG probe sent to lab. Treatment: To be determined once lab results are received ROV prn if symptoms persist or worsen.

## 2022-04-09 LAB — CERVICOVAGINAL ANCILLARY ONLY
Bacterial Vaginitis (gardnerella): POSITIVE — AB
Candida Glabrata: NEGATIVE
Candida Vaginitis: NEGATIVE
Chlamydia: NEGATIVE
Comment: NEGATIVE
Comment: NEGATIVE
Comment: NEGATIVE
Comment: NEGATIVE
Comment: NEGATIVE
Comment: NORMAL
Neisseria Gonorrhea: NEGATIVE
Trichomonas: NEGATIVE

## 2022-04-09 LAB — HIV ANTIBODY (ROUTINE TESTING W REFLEX): HIV Screen 4th Generation wRfx: NONREACTIVE

## 2022-04-09 LAB — RPR: RPR Ser Ql: NONREACTIVE

## 2022-04-09 LAB — HEPATITIS C ANTIBODY: Hep C Virus Ab: NONREACTIVE

## 2022-04-09 LAB — HEPATITIS B SURFACE ANTIGEN: Hepatitis B Surface Ag: NEGATIVE

## 2022-04-16 ENCOUNTER — Other Ambulatory Visit: Payer: Self-pay | Admitting: Obstetrics & Gynecology

## 2022-04-16 DIAGNOSIS — B9689 Other specified bacterial agents as the cause of diseases classified elsewhere: Secondary | ICD-10-CM

## 2022-04-16 MED ORDER — METRONIDAZOLE 500 MG PO TABS: 500.0000 mg | ORAL_TABLET | Freq: Two times a day (BID) | ORAL | 6 refills | Status: AC

## 2022-05-18 ENCOUNTER — Other Ambulatory Visit (HOSPITAL_COMMUNITY)
Admission: RE | Admit: 2022-05-18 | Discharge: 2022-05-18 | Disposition: A | Discharge status: Home / Self Care | Payer: BC Managed Care – PPO | Source: Ambulatory Visit | Attending: Obstetrics and Gynecology | Admitting: Obstetrics and Gynecology

## 2022-05-18 ENCOUNTER — Ambulatory Visit (INDEPENDENT_AMBULATORY_CARE_PROVIDER_SITE_OTHER): Payer: BC Managed Care – PPO | Admitting: *Deleted

## 2022-05-18 VITALS — BP 119/79 | HR 57

## 2022-05-18 DIAGNOSIS — N898 Other specified noninflammatory disorders of vagina: Secondary | ICD-10-CM | POA: Insufficient documentation

## 2022-05-18 NOTE — Progress Notes (Signed)
SUBJECTIVE:  26 y.o. female complains of clear vaginal discharge for 2 week(s). Denies abnormal vaginal bleeding or significant pelvic pain or fever. No UTI symptoms. Denies history of known exposure to STD.  No LMP recorded.  OBJECTIVE:  She appears well, afebrile. Urine dipstick: not done.  ASSESSMENT:  Vaginal Discharge  Vaginal Odor   PLAN:  GC, chlamydia, trichomonas, BVAG, CVAG probe sent to lab. Treatment: To be determined once lab results are received ROV prn if symptoms persist or worsen.

## 2022-05-19 LAB — CERVICOVAGINAL ANCILLARY ONLY
Bacterial Vaginitis (gardnerella): NEGATIVE
Candida Glabrata: NEGATIVE
Candida Vaginitis: NEGATIVE
Chlamydia: NEGATIVE
Comment: NEGATIVE
Comment: NEGATIVE
Comment: NEGATIVE
Comment: NEGATIVE
Comment: NEGATIVE
Comment: NORMAL
Neisseria Gonorrhea: NEGATIVE
Trichomonas: NEGATIVE

## 2022-05-26 DIAGNOSIS — J45909 Unspecified asthma, uncomplicated: Secondary | ICD-10-CM | POA: Diagnosis not present

## 2022-05-26 DIAGNOSIS — R059 Cough, unspecified: Secondary | ICD-10-CM | POA: Diagnosis not present

## 2022-05-31 ENCOUNTER — Other Ambulatory Visit (HOSPITAL_COMMUNITY)
Admission: RE | Admit: 2022-05-31 | Discharge: 2022-05-31 | Disposition: A | Discharge status: Home / Self Care | Payer: BC Managed Care – PPO | Source: Ambulatory Visit | Attending: Obstetrics and Gynecology | Admitting: Obstetrics and Gynecology

## 2022-05-31 ENCOUNTER — Ambulatory Visit: Payer: BC Managed Care – PPO

## 2022-05-31 ENCOUNTER — Ambulatory Visit (INDEPENDENT_AMBULATORY_CARE_PROVIDER_SITE_OTHER): Payer: BC Managed Care – PPO | Admitting: *Deleted

## 2022-05-31 VITALS — BP 125/87 | HR 58

## 2022-05-31 DIAGNOSIS — B3731 Acute candidiasis of vulva and vagina: Secondary | ICD-10-CM | POA: Diagnosis not present

## 2022-05-31 DIAGNOSIS — N898 Other specified noninflammatory disorders of vagina: Secondary | ICD-10-CM | POA: Diagnosis not present

## 2022-05-31 NOTE — Progress Notes (Signed)
SUBJECTIVE:  26 y.o. female complains of white and creamy vaginal discharge for 8 day(s). Denies abnormal vaginal bleeding or significant pelvic pain or fever. No UTI symptoms. Denies history of known exposure to STD.  No LMP recorded.  OBJECTIVE:  She appears well, afebrile. Urine dipstick: not done.  ASSESSMENT:  Vaginal Discharge  Vaginal Odor   PLAN:  BVAG, CVAG probe sent to lab.Pt declines STI testing. Treatment: To be determined once lab results are received ROV prn if symptoms persist or worsen.

## 2022-06-02 ENCOUNTER — Encounter: Payer: Self-pay | Admitting: Obstetrics

## 2022-06-02 LAB — CERVICOVAGINAL ANCILLARY ONLY
Bacterial Vaginitis (gardnerella): NEGATIVE
Candida Glabrata: NEGATIVE
Candida Vaginitis: POSITIVE — AB
Chlamydia: NEGATIVE
Comment: NEGATIVE
Comment: NEGATIVE
Comment: NEGATIVE
Comment: NEGATIVE
Comment: NEGATIVE
Comment: NORMAL
Neisseria Gonorrhea: NEGATIVE
Trichomonas: NEGATIVE

## 2022-06-03 ENCOUNTER — Other Ambulatory Visit: Payer: Self-pay

## 2022-06-03 ENCOUNTER — Other Ambulatory Visit: Payer: Self-pay | Admitting: Obstetrics & Gynecology

## 2022-06-03 DIAGNOSIS — N898 Other specified noninflammatory disorders of vagina: Secondary | ICD-10-CM

## 2022-06-03 MED ORDER — FLUCONAZOLE 150 MG PO TABS: 150.0000 mg | ORAL_TABLET | Freq: Once | ORAL | 0 refills | Status: AC

## 2022-06-04 MED ORDER — FLUCONAZOLE 150 MG PO TABS: 150.0000 mg | ORAL_TABLET | Freq: Once | ORAL | 0 refills | Status: AC

## 2022-06-04 NOTE — Addendum Note (Signed)
Addended by: Mora Bellman on: 06/04/2022 11:51 AM   Modules accepted: Orders

## 2022-06-07 ENCOUNTER — Other Ambulatory Visit: Payer: Self-pay

## 2022-06-07 DIAGNOSIS — B379 Candidiasis, unspecified: Secondary | ICD-10-CM

## 2022-06-07 MED ORDER — FLUCONAZOLE 150 MG PO TABS: 150.0000 mg | ORAL_TABLET | Freq: Once | ORAL | 0 refills | Status: AC

## 2022-06-10 DIAGNOSIS — Z01419 Encounter for gynecological examination (general) (routine) without abnormal findings: Secondary | ICD-10-CM | POA: Diagnosis not present

## 2022-06-10 DIAGNOSIS — Z124 Encounter for screening for malignant neoplasm of cervix: Secondary | ICD-10-CM | POA: Diagnosis not present

## 2022-06-10 DIAGNOSIS — Z113 Encounter for screening for infections with a predominantly sexual mode of transmission: Secondary | ICD-10-CM | POA: Diagnosis not present

## 2022-06-10 DIAGNOSIS — Z6829 Body mass index (BMI) 29.0-29.9, adult: Secondary | ICD-10-CM | POA: Diagnosis not present

## 2022-06-10 DIAGNOSIS — N760 Acute vaginitis: Secondary | ICD-10-CM | POA: Diagnosis not present

## 2022-08-22 IMAGING — US US OB < 14 WEEKS - US OB TV
1 series · 15 of 28 positions shown · non-contrast
Comparison: None Available.

CLINICAL DATA: Vaginal bleeding with left lower quadrant pain for 1
day. Status post D and C.

EXAM:
OBSTETRIC <14 WK US AND TRANSVAGINAL OB US
TECHNIQUE: Both transabdominal and transvaginal ultrasound examinations were
performed for complete evaluation of the gestation as well as the
maternal uterus, adnexal regions, and pelvic cul-de-sac.
Transvaginal technique was performed to assess early pregnancy.

[Series 1: us ob < 14 weeks - us ob tv · 112 acquisitions, 15 frames shown]
[im 1/112]
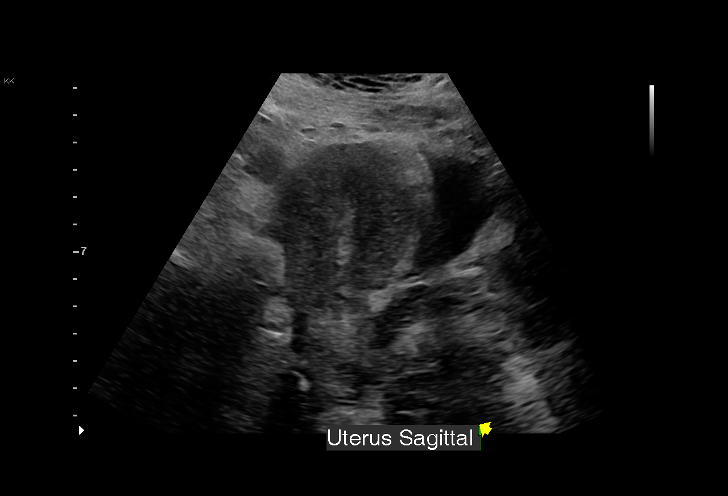
[im 9/112]
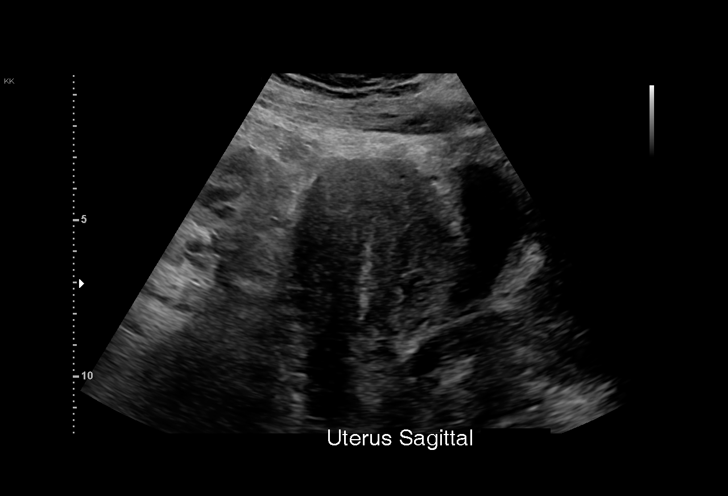
[im 17/112]
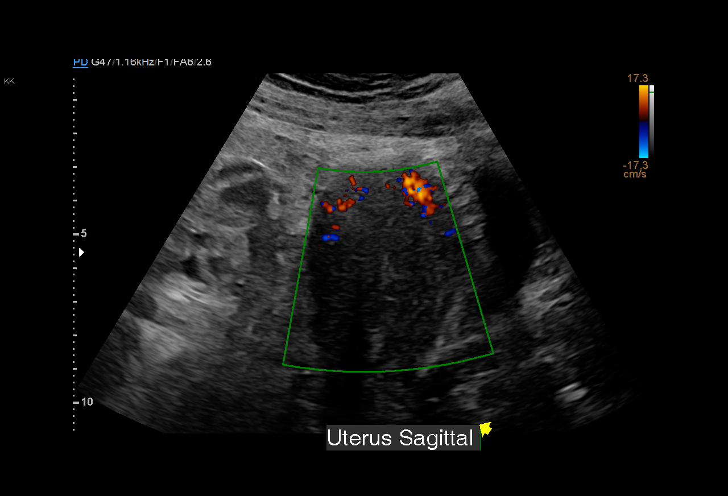
[im 25/112]
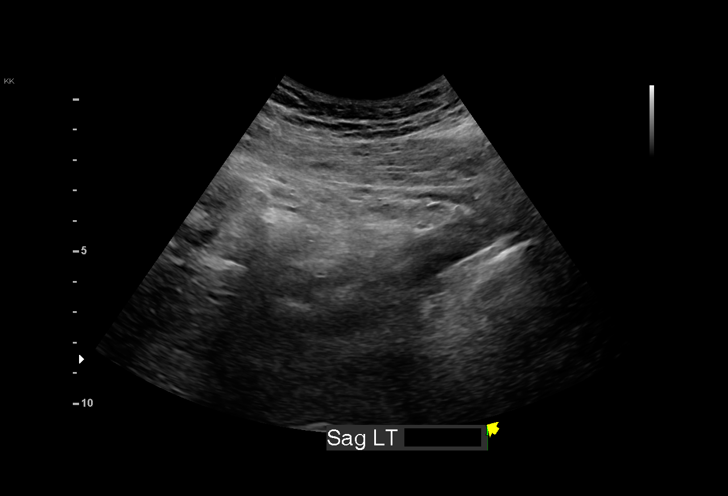
[im 33/112]
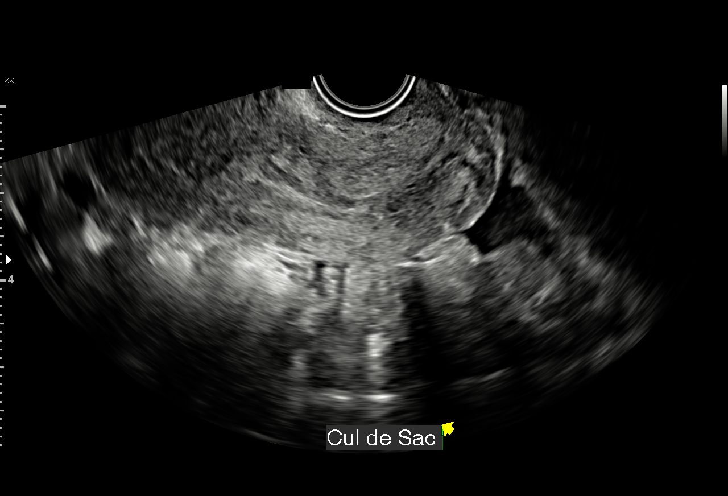
[im 42/112]
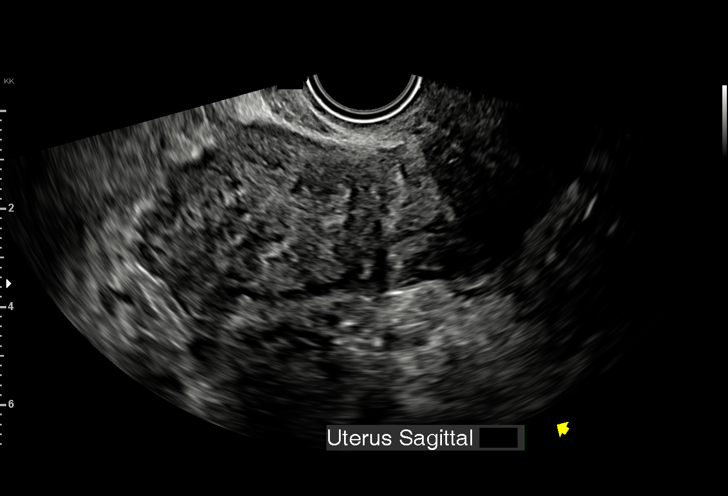
[im 50/112]
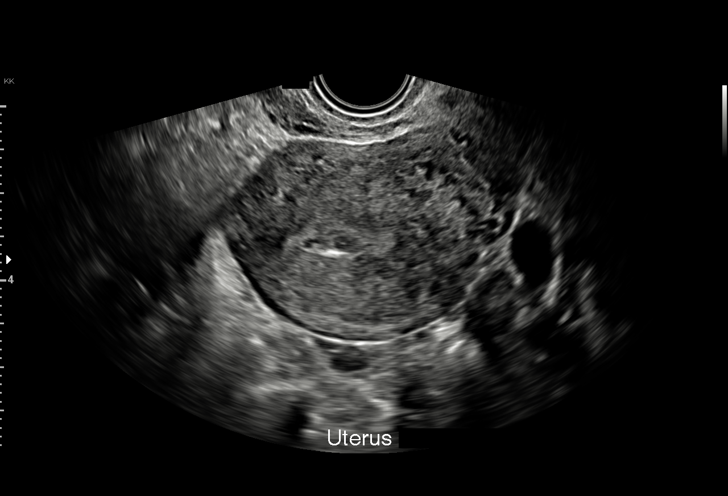
[im 58/112]
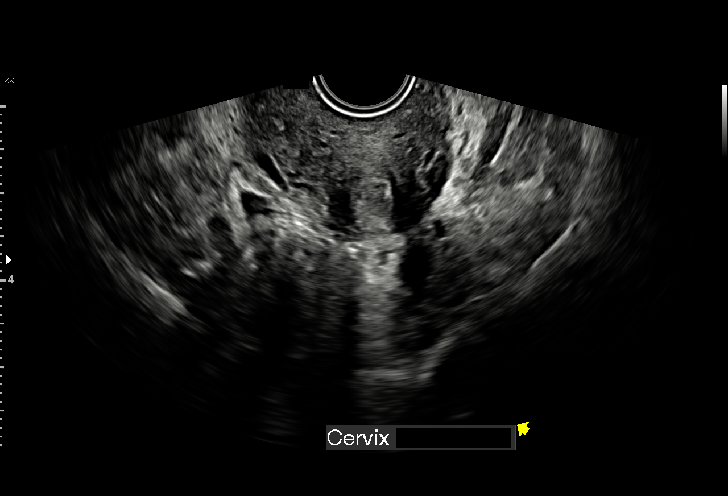
[im 62/112]
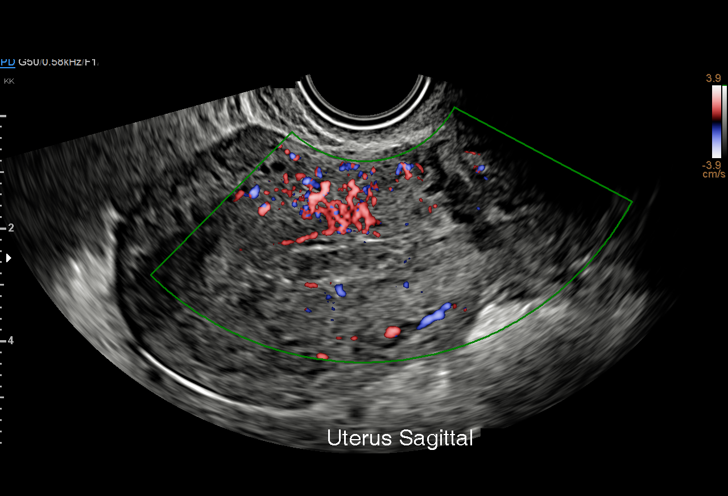
[im 70/112]
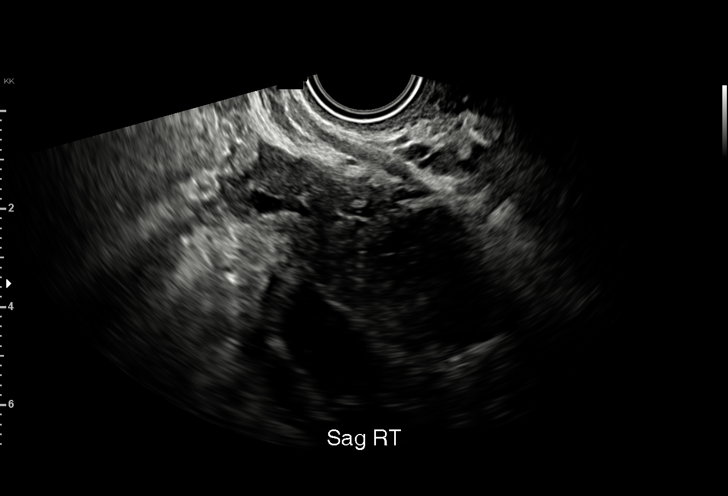
[im 79/112]
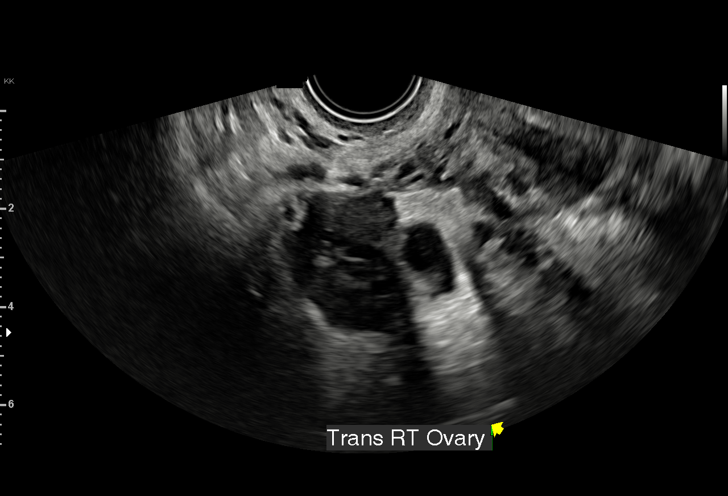
[im 87/112]
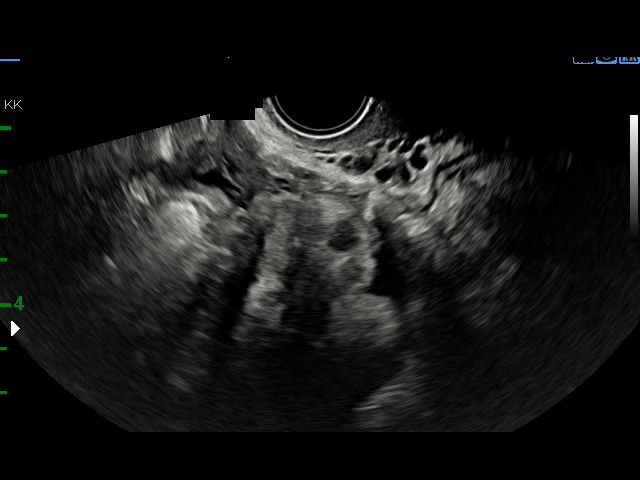
[im 95/112]
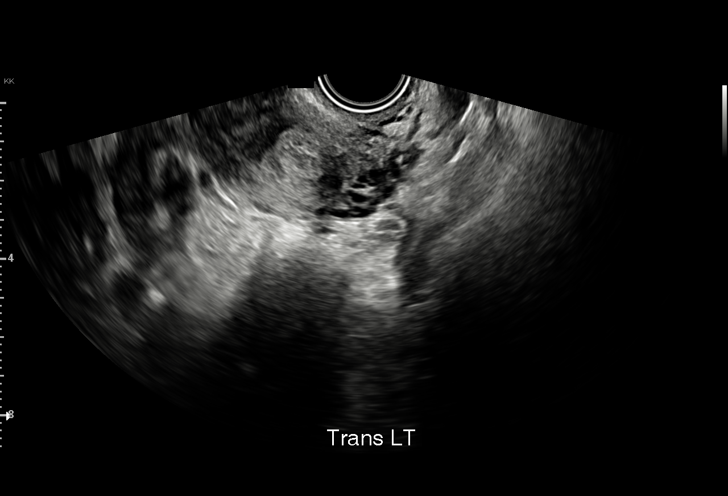
[im 103/112]
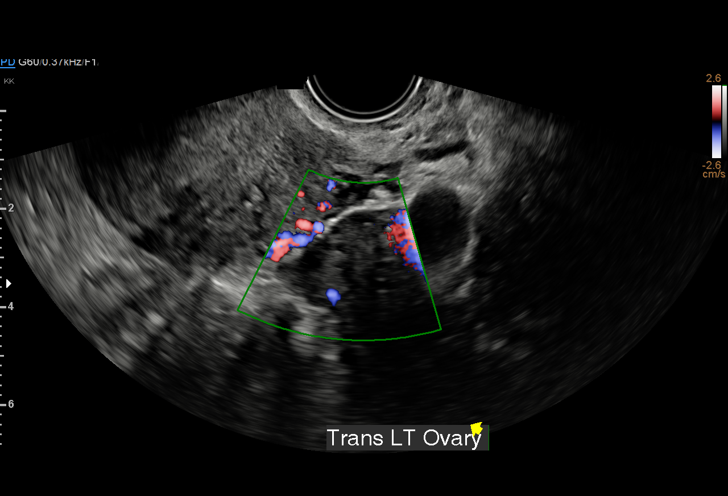
[im 112/112]
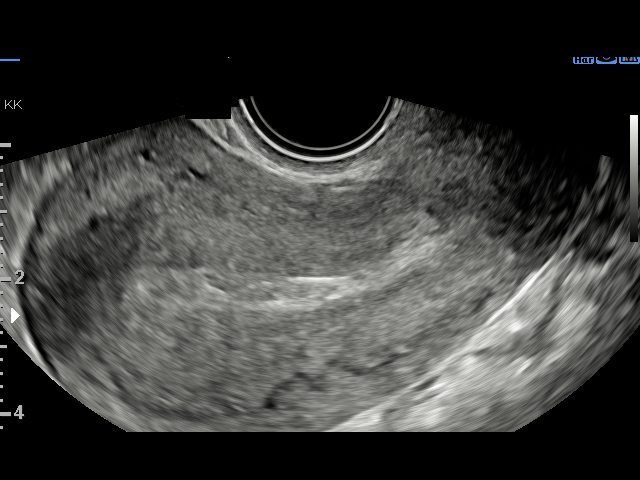

[15 of 28 positions shown; findings below may reference images not displayed]

FINDINGS: No intrauterine gestational sac. Endometrium measures 8 mm in
thickness. Mobile complex contents in the endometrial canal suggest
blood products. No color signal within the endometrium on Doppler
evaluation.

Maternal uterus/adnexae: Right ovary measures 2.4 x 1.9 x 1.7 cm.
Left ovary measures 2.5 x 1.8 x 1.7 cm. No adnexal mass.

Small volume free fluid seen in the cul-de-sac.
IMPRESSION: No evidence for retained products of conception.

## 2022-09-04 IMAGING — DX DG CHEST 2V
2 series · 2 of 2 positions shown · non-contrast
Comparison: None Available.

CLINICAL DATA: Shortness of breath.

EXAM:
CHEST - 2 VIEW

[chest pa]
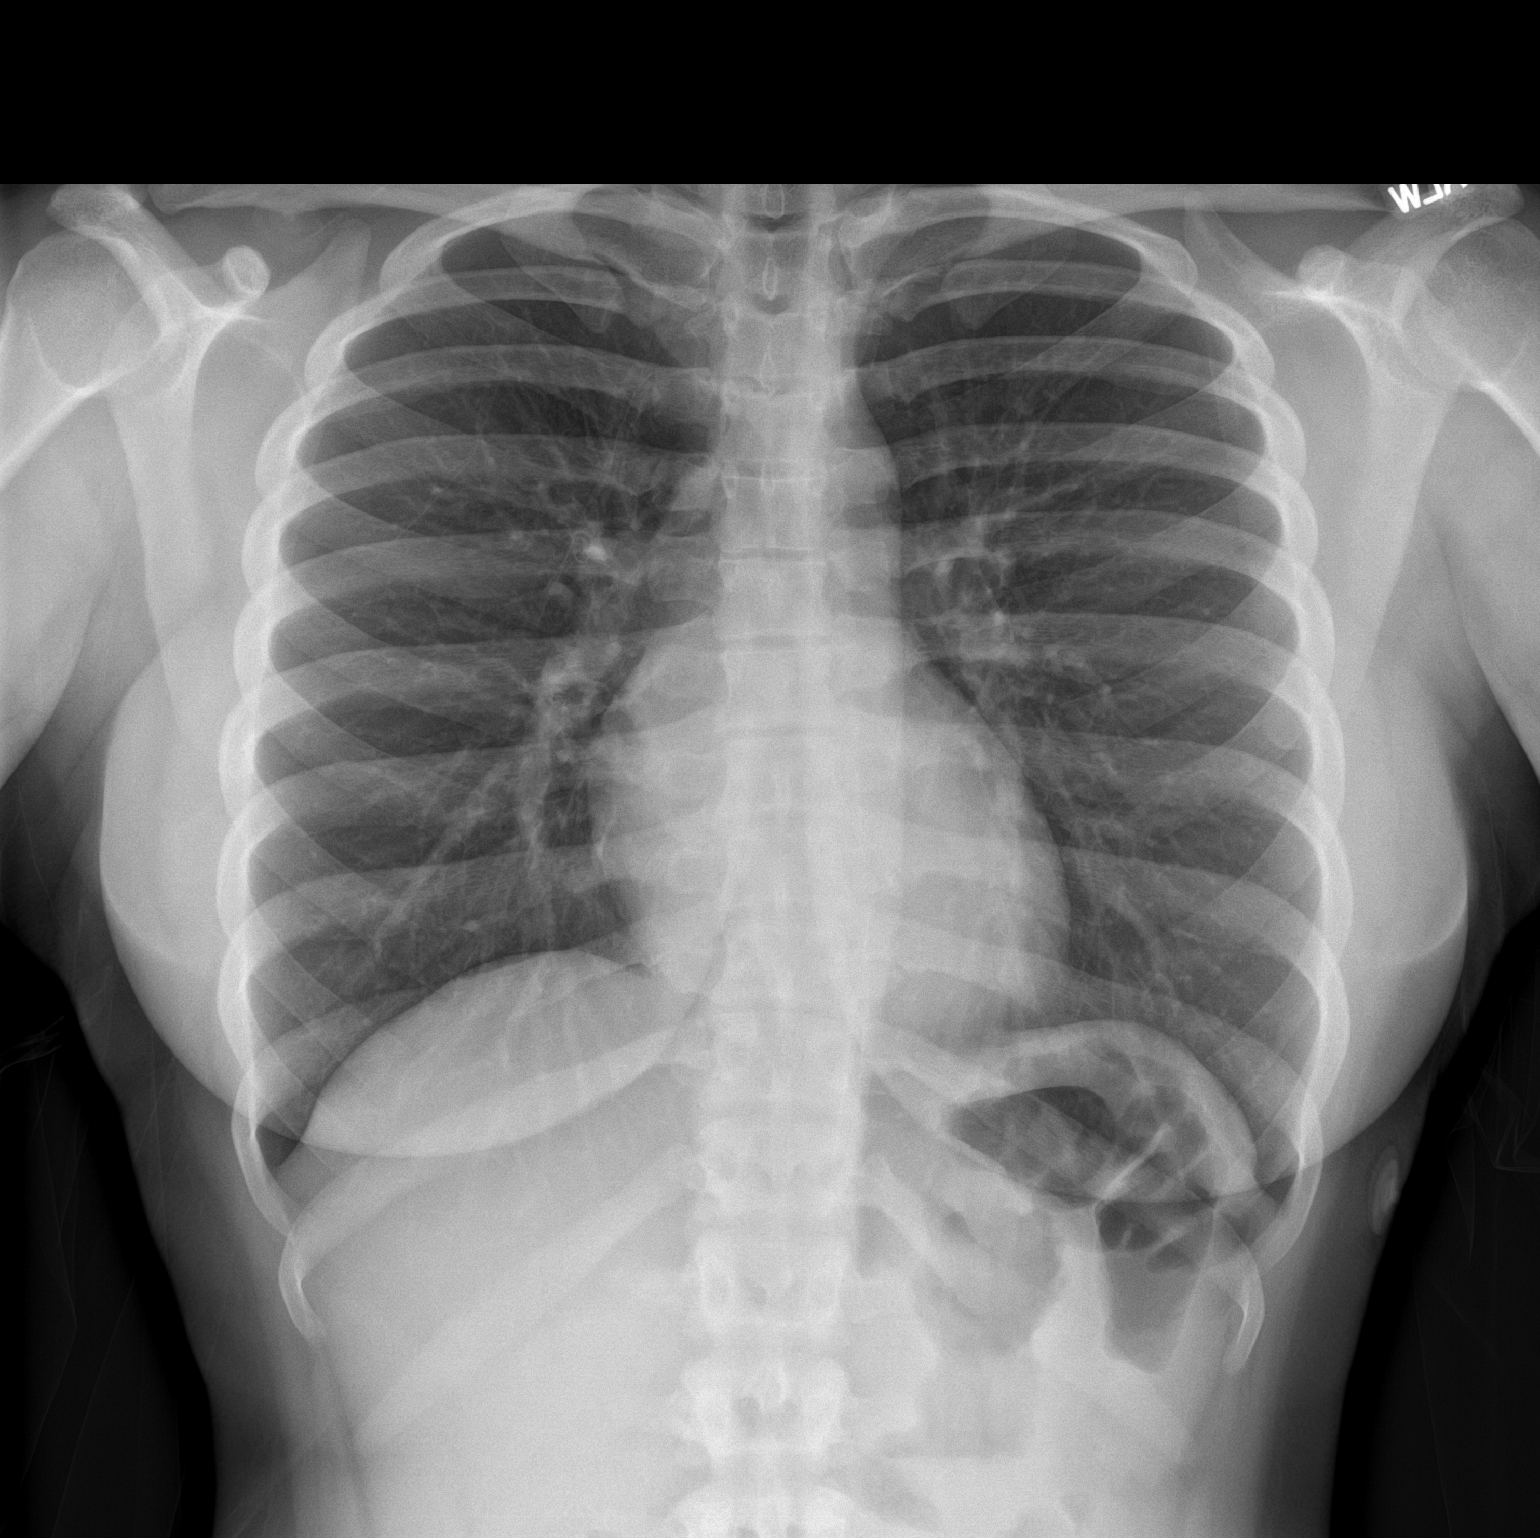

[chest lat]
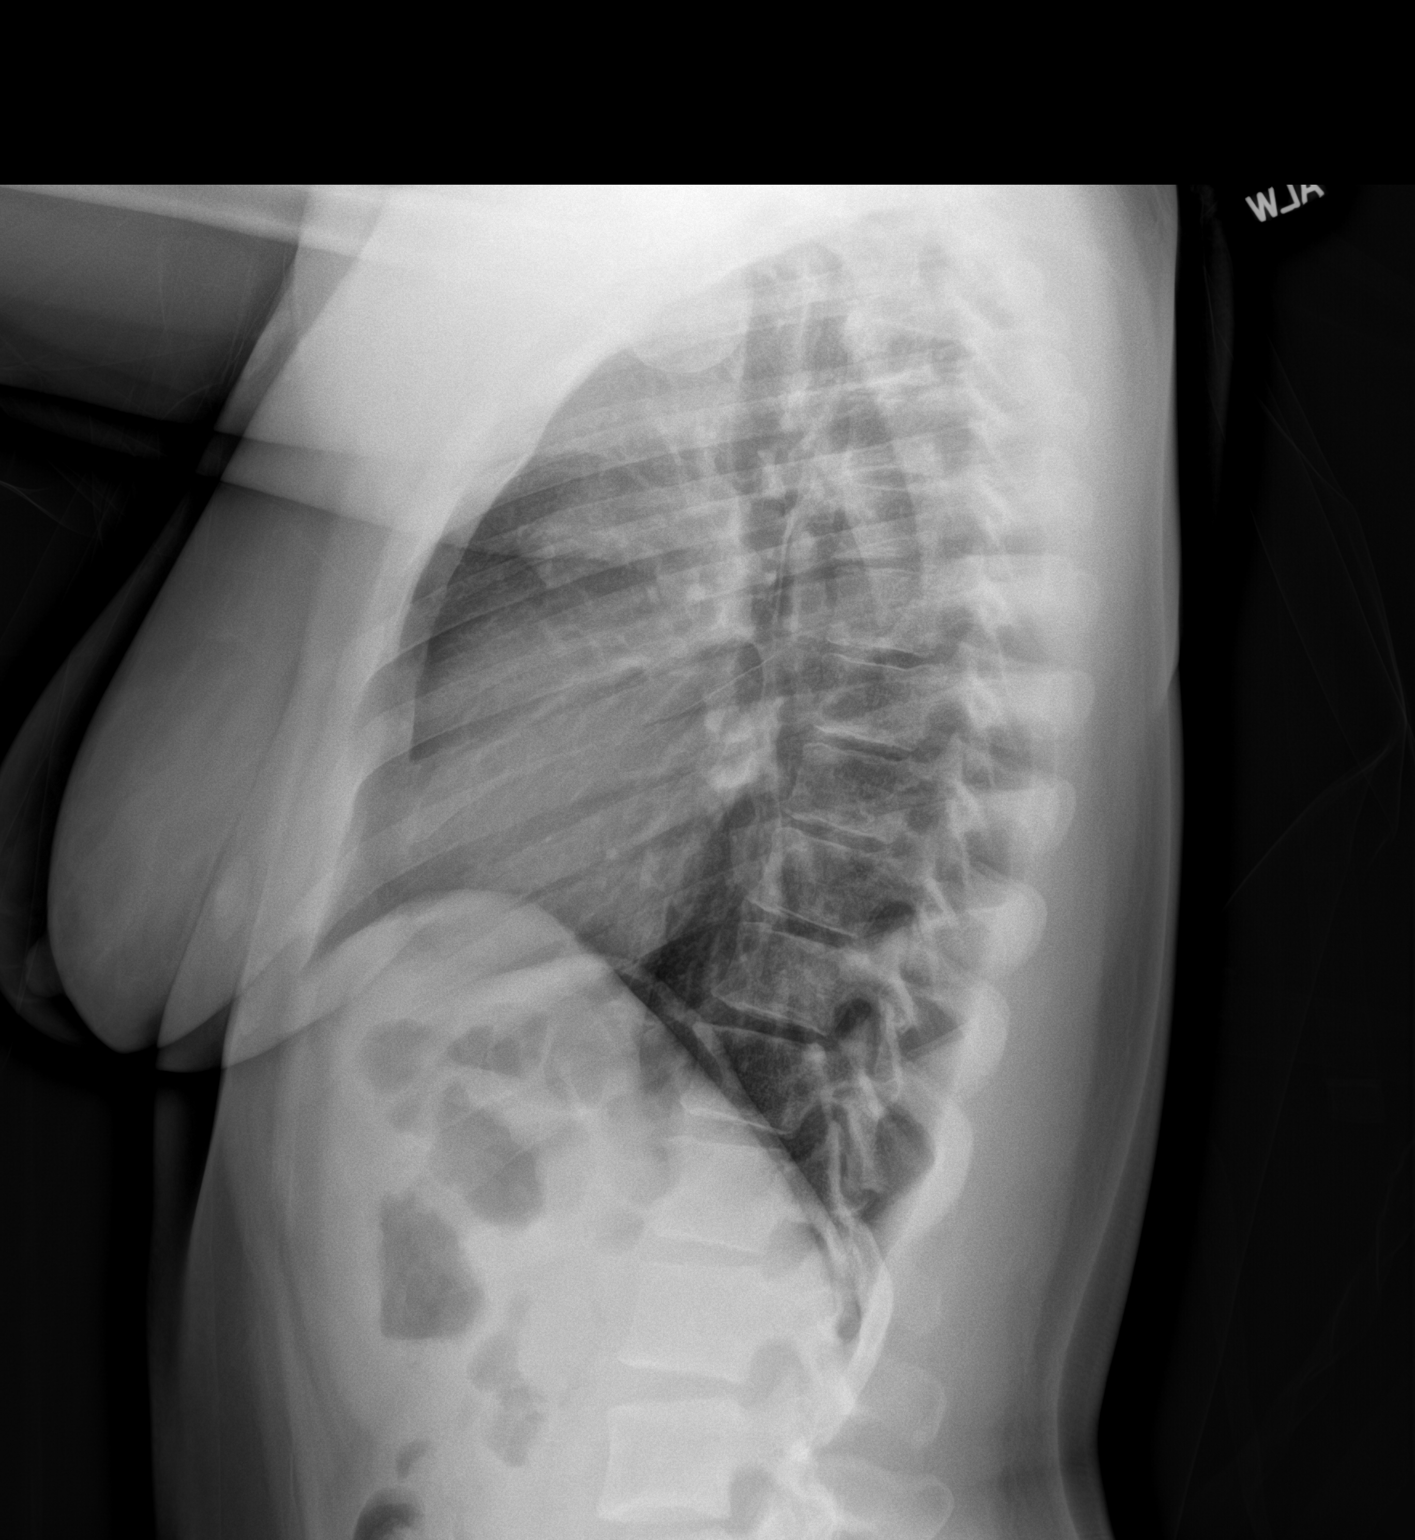

[2 of 2 positions shown; findings below may reference images not displayed]

FINDINGS: No consolidation. No visible pleural effusions or pneumothorax.
Cardiomediastinal silhouette is within normal limits. No displaced
fracture. Mild S-shaped thoracic curvature.
IMPRESSION: No evidence of acute cardiopulmonary disease.

## 2022-09-16 ENCOUNTER — Other Ambulatory Visit (HOSPITAL_COMMUNITY)
Admission: RE | Admit: 2022-09-16 | Discharge: 2022-09-16 | Disposition: A | Discharge status: Home / Self Care | Payer: Medicaid Other | Source: Ambulatory Visit | Attending: Obstetrics and Gynecology | Admitting: Obstetrics and Gynecology

## 2022-09-16 ENCOUNTER — Ambulatory Visit (INDEPENDENT_AMBULATORY_CARE_PROVIDER_SITE_OTHER): Payer: Self-pay | Admitting: General Practice

## 2022-09-16 VITALS — BP 132/97 | HR 64 | Ht 65.0 in | Wt 184.1 lb

## 2022-09-16 DIAGNOSIS — N898 Other specified noninflammatory disorders of vagina: Secondary | ICD-10-CM | POA: Insufficient documentation

## 2022-09-16 MED ORDER — FLUCONAZOLE 150 MG PO TABS
150.0000 mg | ORAL_TABLET | Freq: Once | ORAL | 0 refills | Status: AC
Start: 2022-09-16 — End: 2022-09-16

## 2022-09-16 NOTE — Progress Notes (Signed)
SUBJECTIVE:  27 y.o. female complains of white vaginal discharge , itching and irritation for 3 day(s). Denies abnormal vaginal bleeding or significant pelvic pain or fever. No UTI symptoms. Denies history of known exposure to STD.  No LMP recorded.  OBJECTIVE:  She appears well, afebrile. Urine dipstick: not done.  ASSESSMENT:  Vaginal Discharge  Vaginal Odor   PLAN:  GC, chlamydia, trichomonas, BVAG, CVAG probe sent to lab. Treatment: Diflucan sent per protocol. Further treatment to be determined once lab results are received ROV prn if symptoms persist or worsen.

## 2022-09-17 LAB — CERVICOVAGINAL ANCILLARY ONLY
Bacterial Vaginitis (gardnerella): POSITIVE — AB
Candida Glabrata: NEGATIVE
Candida Vaginitis: NEGATIVE
Chlamydia: NEGATIVE
Comment: NEGATIVE
Comment: NEGATIVE
Comment: NEGATIVE
Comment: NEGATIVE
Comment: NEGATIVE
Comment: NORMAL
Neisseria Gonorrhea: NEGATIVE
Trichomonas: NEGATIVE

## 2022-09-20 MED ORDER — METRONIDAZOLE 500 MG PO TABS: 500.0000 mg | ORAL_TABLET | Freq: Two times a day (BID) | ORAL | 0 refills | Status: AC

## 2022-09-20 NOTE — Addendum Note (Signed)
Addended by: Mora Bellman on: 09/20/2022 12:01 PM   Modules accepted: Orders

## 2023-05-17 ENCOUNTER — Ambulatory Visit (INDEPENDENT_AMBULATORY_CARE_PROVIDER_SITE_OTHER): Payer: Self-pay | Admitting: Emergency Medicine

## 2023-05-17 ENCOUNTER — Other Ambulatory Visit (HOSPITAL_COMMUNITY)
Admission: RE | Admit: 2023-05-17 | Discharge: 2023-05-17 | Disposition: A | Discharge status: Home / Self Care | Payer: Medicaid Other | Source: Ambulatory Visit | Attending: Obstetrics and Gynecology | Admitting: Obstetrics and Gynecology

## 2023-05-17 VITALS — BP 121/88 | HR 95 | Ht 65.0 in | Wt 188.0 lb

## 2023-05-17 DIAGNOSIS — N898 Other specified noninflammatory disorders of vagina: Secondary | ICD-10-CM | POA: Insufficient documentation

## 2023-05-17 DIAGNOSIS — Z113 Encounter for screening for infections with a predominantly sexual mode of transmission: Secondary | ICD-10-CM

## 2023-05-17 NOTE — Progress Notes (Cosign Needed Addendum)
SUBJECTIVE:  27 y.o. female complains of clear vaginal discharge for 3 day(s). Denies abnormal vaginal bleeding or significant pelvic pain or fever. No UTI symptoms. Denies history of known exposure to STD.  No LMP recorded.  OBJECTIVE:  She appears well, afebrile. Urine dipstick: not done.  ASSESSMENT:  Vaginal Discharge  Vaginal Odor   PLAN:  GC, chlamydia, trichomonas, BVAG, CVAG probe sent to lab. Treatment: To be determined once lab results are received ROV prn if symptoms persist or worsen.

## 2023-05-18 LAB — CERVICOVAGINAL ANCILLARY ONLY
Bacterial Vaginitis (gardnerella): POSITIVE — AB
Candida Glabrata: NEGATIVE
Candida Vaginitis: NEGATIVE
Chlamydia: NEGATIVE
Comment: NEGATIVE
Comment: NEGATIVE
Comment: NEGATIVE
Comment: NEGATIVE
Comment: NEGATIVE
Comment: NORMAL
Neisseria Gonorrhea: NEGATIVE
Trichomonas: NEGATIVE

## 2023-05-18 MED ORDER — METRONIDAZOLE 500 MG PO TABS: 500.0000 mg | ORAL_TABLET | Freq: Two times a day (BID) | ORAL | 0 refills | Status: AC

## 2023-05-18 NOTE — Addendum Note (Signed)
Addended by: Catalina Antigua on: 05/18/2023 06:06 PM   Modules accepted: Orders
